# Patient Record
Sex: Female | Born: 1955 | Race: White | Hispanic: No | Marital: Married | Smoking: Former smoker
Health system: Southern US, Community
[De-identification: ages and names within clinical notes are randomized; demographics above are authoritative.]

## PROBLEM LIST (undated history)

## (undated) DIAGNOSIS — I4891 Unspecified atrial fibrillation: Secondary | ICD-10-CM

## (undated) DIAGNOSIS — F32A Depression, unspecified: Secondary | ICD-10-CM

## (undated) DIAGNOSIS — E785 Hyperlipidemia, unspecified: Secondary | ICD-10-CM

## (undated) DIAGNOSIS — I719 Aortic aneurysm of unspecified site, without rupture: Secondary | ICD-10-CM

## (undated) DIAGNOSIS — K602 Anal fissure, unspecified: Secondary | ICD-10-CM

## (undated) DIAGNOSIS — I4892 Unspecified atrial flutter: Secondary | ICD-10-CM

## (undated) DIAGNOSIS — F419 Anxiety disorder, unspecified: Secondary | ICD-10-CM

## (undated) HISTORY — DX: Aortic aneurysm of unspecified site, without rupture: I71.9

## (undated) HISTORY — DX: Unspecified atrial fibrillation: I48.91

## (undated) HISTORY — PX: HIATAL HERNIA REPAIR: SHX195

## (undated) HISTORY — PX: PLACEMENT OF BREAST IMPLANTS: SHX6334

## (undated) HISTORY — PX: CARDIAC ELECTROPHYSIOLOGY MAPPING AND ABLATION: SHX1292

## (undated) HISTORY — PX: COLONOSCOPY: SHX174

## (undated) HISTORY — DX: Anal fissure, unspecified: K60.2

## (undated) HISTORY — PX: CARDIAC PACEMAKER PLACEMENT: SHX583

## (undated) HISTORY — PX: TONSILLECTOMY: SUR1361

## (undated) HISTORY — DX: Anxiety disorder, unspecified: F41.9

## (undated) HISTORY — PX: ABDOMINAL HYSTERECTOMY: SHX81

## (undated) HISTORY — DX: Depression, unspecified: F32.A

## (undated) HISTORY — DX: Hyperlipidemia, unspecified: E78.5

## (undated) HISTORY — PX: BREAST IMPLANT REMOVAL: SUR1101

## (undated) HISTORY — DX: Unspecified atrial flutter: I48.92

---

## 2014-08-18 DIAGNOSIS — E348 Other specified endocrine disorders: Secondary | ICD-10-CM | POA: Insufficient documentation

## 2015-01-02 DIAGNOSIS — I10 Essential (primary) hypertension: Secondary | ICD-10-CM | POA: Insufficient documentation

## 2015-01-02 DIAGNOSIS — R002 Palpitations: Secondary | ICD-10-CM | POA: Insufficient documentation

## 2015-01-02 DIAGNOSIS — I483 Typical atrial flutter: Secondary | ICD-10-CM | POA: Insufficient documentation

## 2015-10-11 DIAGNOSIS — N35919 Unspecified urethral stricture, male, unspecified site: Secondary | ICD-10-CM | POA: Insufficient documentation

## 2015-10-12 DIAGNOSIS — R1319 Other dysphagia: Secondary | ICD-10-CM | POA: Insufficient documentation

## 2015-10-12 DIAGNOSIS — K2100 Gastro-esophageal reflux disease with esophagitis, without bleeding: Secondary | ICD-10-CM | POA: Insufficient documentation

## 2015-12-04 DIAGNOSIS — Z7901 Long term (current) use of anticoagulants: Secondary | ICD-10-CM | POA: Insufficient documentation

## 2016-04-02 DIAGNOSIS — R609 Edema, unspecified: Secondary | ICD-10-CM | POA: Insufficient documentation

## 2016-04-02 DIAGNOSIS — Z9889 Other specified postprocedural states: Secondary | ICD-10-CM | POA: Insufficient documentation

## 2016-11-26 DIAGNOSIS — N362 Urethral caruncle: Secondary | ICD-10-CM | POA: Insufficient documentation

## 2016-12-26 DIAGNOSIS — R8271 Bacteriuria: Secondary | ICD-10-CM | POA: Insufficient documentation

## 2017-01-17 DIAGNOSIS — Z9889 Other specified postprocedural states: Secondary | ICD-10-CM | POA: Insufficient documentation

## 2019-01-06 DIAGNOSIS — Z45018 Encounter for adjustment and management of other part of cardiac pacemaker: Secondary | ICD-10-CM | POA: Insufficient documentation

## 2019-08-24 DIAGNOSIS — I951 Orthostatic hypotension: Secondary | ICD-10-CM | POA: Insufficient documentation

## 2020-02-10 DIAGNOSIS — R5382 Chronic fatigue, unspecified: Secondary | ICD-10-CM | POA: Insufficient documentation

## 2020-02-10 DIAGNOSIS — Z950 Presence of cardiac pacemaker: Secondary | ICD-10-CM | POA: Insufficient documentation

## 2020-02-10 DIAGNOSIS — F339 Major depressive disorder, recurrent, unspecified: Secondary | ICD-10-CM | POA: Insufficient documentation

## 2020-02-10 DIAGNOSIS — E782 Mixed hyperlipidemia: Secondary | ICD-10-CM | POA: Insufficient documentation

## 2020-02-22 ENCOUNTER — Telehealth: Payer: Self-pay

## 2020-02-22 NOTE — Telephone Encounter (Signed)
Hilarie Fredrickson, MD  Chrystie Nose, RN Bonita Quin, I received a call from Dr. Valera Castle (former [place] cardiologist, well-known to me, now working at [facility] in [facility]) regarding a patient of his that he would like seen. The patient is a friend of his. I could not find her in our system. We need to arrange an office appointment to see me. Also obtain any and all outside records. He tells me that there may be records available in care everywhere. Here is what I know:  Patient name: [name] "Monica Henry "[name]. Date of birth: 08/15/1955 Phone number: [phone]  Problem: Elevated liver tests. Apparently has had blood work including liver test, CBC, autoimmune studies, viral studies  Obtain any and all outside records relevant to this issue. Dr. Vern Claude contacts at his office are named Neysa Bonito and Ursa. The number there is [phone]  I can see her as a work in appointment on Tuesday, February 29, 2020 at 1:30 PM. Please contact patient regarding this appointment.  Please put all records in my box for review, prior patient visit.  Thank you Bonita Quin!  Dr. Marina Goodell   Pt scheduled to see Dr. Marina Goodell 02/29/20@1 :40pm. Called Dr. Vern Claude office, spoke with Neysa Bonito and records are being faxed.

## 2020-02-29 ENCOUNTER — Ambulatory Visit: Payer: Medicare HMO | Admitting: Internal Medicine

## 2020-02-29 ENCOUNTER — Encounter: Payer: Self-pay | Admitting: Internal Medicine

## 2020-02-29 ENCOUNTER — Other Ambulatory Visit (INDEPENDENT_AMBULATORY_CARE_PROVIDER_SITE_OTHER): Payer: Medicare HMO

## 2020-02-29 VITALS — BP 108/70 | HR 68 | Ht 62.5 in | Wt 186.2 lb

## 2020-02-29 DIAGNOSIS — R7989 Other specified abnormal findings of blood chemistry: Secondary | ICD-10-CM | POA: Diagnosis not present

## 2020-02-29 DIAGNOSIS — Z7289 Other problems related to lifestyle: Secondary | ICD-10-CM | POA: Diagnosis not present

## 2020-02-29 DIAGNOSIS — K222 Esophageal obstruction: Secondary | ICD-10-CM | POA: Diagnosis not present

## 2020-02-29 DIAGNOSIS — K2100 Gastro-esophageal reflux disease with esophagitis, without bleeding: Secondary | ICD-10-CM

## 2020-02-29 DIAGNOSIS — F1090 Alcohol use, unspecified, uncomplicated: Secondary | ICD-10-CM

## 2020-02-29 LAB — CBC WITH DIFFERENTIAL/PLATELET
Basophils Absolute: 0 10*3/uL (ref 0.0–0.1)
Basophils Relative: 0.7 % (ref 0.0–3.0)
Eosinophils Absolute: 0.1 10*3/uL (ref 0.0–0.7)
Eosinophils Relative: 1.5 % (ref 0.0–5.0)
HCT: 38.7 % (ref 36.0–46.0)
Hemoglobin: 12.8 g/dL (ref 12.0–15.0)
Lymphocytes Relative: 26.3 % (ref 12.0–46.0)
Lymphs Abs: 1.3 10*3/uL (ref 0.7–4.0)
MCHC: 33 g/dL (ref 30.0–36.0)
MCV: 106.5 fl — ABNORMAL HIGH (ref 78.0–100.0)
Monocytes Absolute: 0.8 10*3/uL (ref 0.1–1.0)
Monocytes Relative: 16.5 % — ABNORMAL HIGH (ref 3.0–12.0)
Neutro Abs: 2.6 10*3/uL (ref 1.4–7.7)
Neutrophils Relative %: 55 % (ref 43.0–77.0)
Platelets: 79 10*3/uL — ABNORMAL LOW (ref 150.0–400.0)
RBC: 3.63 Mil/uL — ABNORMAL LOW (ref 3.87–5.11)
RDW: 14.2 % (ref 11.5–15.5)
WBC: 4.8 10*3/uL (ref 4.0–10.5)

## 2020-02-29 LAB — HEPATIC FUNCTION PANEL
ALT: 451 U/L — ABNORMAL HIGH (ref 0–35)
AST: 476 U/L — ABNORMAL HIGH (ref 0–37)
Albumin: 3.8 g/dL (ref 3.5–5.2)
Alkaline Phosphatase: 157 U/L — ABNORMAL HIGH (ref 39–117)
Bilirubin, Direct: 1 mg/dL — ABNORMAL HIGH (ref 0.0–0.3)
Total Bilirubin: 2.5 mg/dL — ABNORMAL HIGH (ref 0.2–1.2)
Total Protein: 6.6 g/dL (ref 6.0–8.3)

## 2020-02-29 LAB — BASIC METABOLIC PANEL
BUN: 9 mg/dL (ref 6–23)
CO2: 29 mEq/L (ref 19–32)
Calcium: 9.1 mg/dL (ref 8.4–10.5)
Chloride: 103 mEq/L (ref 96–112)
Creatinine, Ser: 0.55 mg/dL (ref 0.40–1.20)
GFR: 110.36 mL/min (ref >60.00)
Glucose, Bld: 85 mg/dL (ref 70–99)
Potassium: 3.9 mEq/L (ref 3.5–5.1)
Sodium: 138 mEq/L (ref 135–145)

## 2020-02-29 LAB — TSH: TSH: 1.97 u[IU]/mL (ref 0.35–4.50)

## 2020-02-29 LAB — IBC PANEL
Iron: 192 ug/dL — ABNORMAL HIGH (ref 42–145)
Saturation Ratios: 59.1 % — ABNORMAL HIGH (ref 20.0–50.0)
Transferrin: 232 mg/dL (ref 212.0–360.0)

## 2020-02-29 LAB — PROTIME-INR
INR: 2.2 ratio — ABNORMAL HIGH (ref 0.8–1.0)
Prothrombin Time: 24.2 s — ABNORMAL HIGH (ref 9.6–13.1)

## 2020-02-29 LAB — IRON: Iron: 192 ug/dL — ABNORMAL HIGH (ref 42–145)

## 2020-02-29 LAB — FERRITIN: Ferritin: 888.1 ng/mL — ABNORMAL HIGH (ref 10.0–291.0)

## 2020-02-29 NOTE — Patient Instructions (Addendum)
Your provider has requested that you go to the basement level for lab work before leaving today. Press "B" on the elevator. The lab is located at the first door on the left as you exit the elevator.  You have been scheduled for an abdominal ultrasound at [facility] (1st floor of hospital) on 03/17/2020 at 9:30am. Please arrive 15 minutes prior to your appointment for registration. Make certain not to have anything to eat or drink after midnight prior to your appointment. Should you need to reschedule your appointment, please contact radiology at [phone]. This test typically takes about 30 minutes to perform.  Please abstain from alcohol until further notice

## 2020-03-01 ENCOUNTER — Encounter: Payer: Self-pay | Admitting: Internal Medicine

## 2020-03-01 NOTE — Progress Notes (Signed)
HISTORY OF PRESENT ILLNESS:  Monica Henry is a 64 y.o. 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 Valera Castle, regarding elevated liver tests.  Patient tells me that she was diagnosed with elevated liver test several years ago when she was being evaluated and treated for her atrial arrhythmia.  Apparently, this resolved without definitive etiology identified.  She reports being in her usual state of health until the past 6 months when she developed problems with fairly constant fatigue.  This despite having recently retired.  She tells me that she saw her primary care provider back in April 2021 and underwent unspecified blood work which was "okay".  She recently established with Dr. Daleen Squibb and underwent evaluation February 10, 2020.  Due to complaints of fatigue she underwent extensive blood work and discontinuation of her statin drug.  Liver tests are markedly abnormal with AST 1292, ALT 762, complement phosphatase 137, total bilirubin 1.3.  Globulins were normal with total protein 7.1 and albumin 4.0.  CBC revealed a normal hemoglobin of 13.1.  Elevated MCV 104.3.  Decreased platelet count at 98,000.  Normal TSH.  Acute hepatitis serologies were negative.  ANA was mildly elevated at 1-160.  Cardiac echo revealed normal left ventricular function.  The patient does admit to drinking 1 bottle of wine per day.  She has not been drinking (except for beer 1 weekend) after being advised to such 2 weeks ago.  She tells me that she is feeling slightly better.  She does report that her brother had cirrhosis which she believes was related to heavy alcohol abuse.  She denies NSAIDs, Tylenol, or over-the-counter remedies.  Over this year she has gained about 10 pounds.  No new medications.  She denies edema or GI bleeding.  She does tell me that she had breast  implants removed May 2021, due to rupturing.  She has had GI care elsewhere.  Review of additional outside records finds colonoscopy and upper endoscopy being performed Nov 22, 2016 by Dr. Brennan Bailey.  Upper endoscopy was being performed for dysphagia.  She was found to have an esophageal stricture and esophagitis.  Stricture was balloon dilated to 19 mm.  Patient is on no medication for acid reflux.  She denies further problems with dysphagia though she does have occasional reflux symptoms.  Her colonoscopy was for the purposes of screening.  The colon was described as tortuous.  The exam was normal.  Follow-up in 10 years recommended.  REVIEW OF SYSTEMS:  All non-GI ROS negative otherwise stated in the HPI except for anxiety, fatigue, hearing problems, heart rhythm change, itching, shortness of breath, skin rash  Past Medical History:  Diagnosis Date  . A-fib (HCC)   . Anal fissure   . Anxiety   . Aortic aneurysm (HCC)   . Atrial flutter (HCC)   . Depression   . Hyperlipidemia     Past Surgical History:  Procedure Laterality Date  . ABDOMINAL HYSTERECTOMY    . BREAST IMPLANT REMOVAL Bilateral   . CARDIAC ELECTROPHYSIOLOGY MAPPING AND ABLATION    . CARDIAC PACEMAKER PLACEMENT    . HIATAL HERNIA REPAIR    . PLACEMENT OF BREAST IMPLANTS Bilateral   . TONSILLECTOMY      Social History Monica Henry  reports that she quit smoking about 40 years ago. Her smoking use included cigarettes. She has never used smokeless tobacco. She  reports current alcohol use of about 1.0 standard drink of alcohol per week. She reports that she does not use drugs.  family history includes Basal cell carcinoma in her father; Cirrhosis in her brother; Diabetes in her maternal grandfather; Diverticulitis in her sister; Heart attack in her brother; Heart disease in her father, mother, and sister; Hypertension in her father; Kidney disease in her brother; Sleep apnea in her sister; Stroke in her maternal  grandmother; Stroke (age of onset: 37) in her son.  Allergies  Allergen Reactions  . Sulfa Antibiotics Nausea And Vomiting       PHYSICAL EXAMINATION: Vital signs: BP 108/70 (BP Location: Left Arm, Patient Position: Sitting, Cuff Size: Normal)   Pulse 68   Ht 5' 2.5" (1.588 m) Comment: height measured without shoes  Wt 186 lb 4 oz (84.5 kg)   BMI 33.52 kg/m   Constitutional: generally well-appearing, no acute distress Psychiatric: alert and oriented x3, cooperative Eyes: extraocular movements intact, anicteric, conjunctiva pink Mouth: oral pharynx moist, no lesions Neck: supple no lymphadenopathy Cardiovascular: heart regular rate and rhythm, no murmur Lungs: clear to auscultation bilaterally Abdomen: soft, nontender, nondistended, no obvious ascites, no peritoneal signs, normal bowel sounds, no organomegaly Rectal: Omitted Extremities: no clubbing, cyanosis, or lower extremity edema bilaterally Skin: no lesions on visible extremities Neuro: No focal deficits. No asterixis.    ASSESSMENT:  1.  Markedly elevated hepatic transaminases.  The differential diagnosis of this pattern includes viral agents, toxins, vascular insult, autoimmune hepatitis.  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  Chronic alcohol abuse.  Now abstaining 3.  Brother with cirrhosis, presumed to be alcohol related 4.  Fatigue.  Presumably related to her liver issues 5.  History of atrial arrhythmia status post ablation on chronic anticoagulation therapy 6.  GERD with a history of esophagitis and esophageal stricture.  Status post dilation May 2018.  No longer on reflux medication.  Denies recurrent dysphagia   PLAN:  1.  Expand laboratory work-up to evaluate further for causes of elevated hepatic transaminases.  Also, repeat liver tests and obtain PT/INR to assess synthetic function 2.  Continue to avoid all alcohol for the time being 3.  Abdominal ultrasound with attention to the liver 4.  Patient  may need a liver biopsy.  We discussed this today.  I spoke with Dr. Daleen Squibb, her cardiologist, who approved holding her oral anticoagulation therapy should biopsy be needed. 5.  Reflux precautions 6.  Consider PPI given history of esophagitis and stricture 7.  Due for repeat routine screening colonoscopy around 2028 8.  GI follow-up to be arranged after the above. A total time of 60 minutes was spent preparing to see the patient, reviewing outside tests, radiology, and procedures.  Reviewing outside history.  Separately obtaining current history.  Performing comprehensive physical examination.  Counseling the patient regarding her above listed issues.  Ordering advanced laboratory testing and radiology.  Arranging follow-up.  Documenting clinical information in the health record

## 2020-03-04 LAB — HEPATITIS A ANTIBODY, TOTAL: Hepatitis A AB,Total: NONREACTIVE

## 2020-03-04 LAB — ALPHA-1-ANTITRYPSIN: A-1 Antitrypsin, Ser: 138 mg/dL (ref 83–199)

## 2020-03-04 LAB — ANTI-NUCLEAR AB-TITER (ANA TITER)
ANA TITER: 1:320 {titer} — ABNORMAL HIGH
ANA Titer 1: 1:80 {titer} — ABNORMAL HIGH

## 2020-03-04 LAB — ANTI-SMOOTH MUSCLE ANTIBODY, IGG: Actin (Smooth Muscle) Antibody (IGG): <20 U (ref <20)

## 2020-03-04 LAB — ANA: Anti Nuclear Antibody (ANA): POSITIVE — AB

## 2020-03-04 LAB — MITOCHONDRIAL ANTIBODIES: Mitochondrial M2 Ab, IgG: <=20 U

## 2020-03-04 LAB — CERULOPLASMIN: Ceruloplasmin: 31 mg/dL (ref 18–53)

## 2020-03-07 ENCOUNTER — Other Ambulatory Visit: Payer: Self-pay

## 2020-03-07 DIAGNOSIS — R7989 Other specified abnormal findings of blood chemistry: Secondary | ICD-10-CM

## 2020-03-07 DIAGNOSIS — F1090 Alcohol use, unspecified, uncomplicated: Secondary | ICD-10-CM

## 2020-03-08 ENCOUNTER — Other Ambulatory Visit: Payer: Self-pay

## 2020-03-08 DIAGNOSIS — F1090 Alcohol use, unspecified, uncomplicated: Secondary | ICD-10-CM

## 2020-03-08 DIAGNOSIS — R7989 Other specified abnormal findings of blood chemistry: Secondary | ICD-10-CM

## 2020-03-15 DIAGNOSIS — R55 Syncope and collapse: Secondary | ICD-10-CM | POA: Insufficient documentation

## 2020-03-15 DIAGNOSIS — K754 Autoimmune hepatitis: Secondary | ICD-10-CM | POA: Insufficient documentation

## 2020-03-15 DIAGNOSIS — Z7901 Long term (current) use of anticoagulants: Secondary | ICD-10-CM | POA: Insufficient documentation

## 2020-03-17 ENCOUNTER — Other Ambulatory Visit: Payer: Self-pay

## 2020-03-17 ENCOUNTER — Ambulatory Visit (HOSPITAL_COMMUNITY)
Admission: RE | Admit: 2020-03-17 | Discharge: 2020-03-17 | Disposition: A | Discharge status: Home / Self Care | Payer: Medicare HMO | Source: Ambulatory Visit | Attending: Internal Medicine | Admitting: Internal Medicine

## 2020-03-17 DIAGNOSIS — R7989 Other specified abnormal findings of blood chemistry: Secondary | ICD-10-CM | POA: Insufficient documentation

## 2020-03-21 ENCOUNTER — Telehealth: Payer: Self-pay | Admitting: Internal Medicine

## 2020-03-21 NOTE — Telephone Encounter (Signed)
Spoke with pt and she is aware of results, see result note. 

## 2020-03-29 ENCOUNTER — Ambulatory Visit: Payer: Medicare HMO | Admitting: Internal Medicine

## 2020-03-30 ENCOUNTER — Telehealth: Payer: Self-pay

## 2020-03-30 NOTE — Telephone Encounter (Signed)
-----   Message from Hilarie Fredrickson, MD sent at 03/29/2020  1:57 PM EDT ----- Regarding: Canceled appointment Tosh Glaze,I have been evaluating this patient very recently for significantly abnormal liver tests.  She was scheduled for an office visit this morning.  She canceled (not sure why) and rescheduled for December.  I really need to see her sooner rather than later.  Please contact her and see what is going on.  Thank you.JP

## 2020-03-30 NOTE — Telephone Encounter (Signed)
Attempted to call pt, phone has continuous busy signal.

## 2020-04-03 NOTE — Telephone Encounter (Signed)
Called pt and get continuous busy signal.

## 2020-04-06 NOTE — Telephone Encounter (Signed)
Attempted to reach pt by phone, still get fast busy signal. Letter mailed to pt requesting she contact our office to be seen sooner.

## 2020-04-06 NOTE — Telephone Encounter (Signed)
Thank you for trying to reach her.  Thank you for sending her letter.  I will let the referring doctor know.   Thanks

## 2020-04-06 NOTE — Telephone Encounter (Signed)
Pt just called and I moved her appt to 05/08/20. She saw the letter on mychart. Let her know we had not been able to reach her via phone. States she is coming on Monday for labs.

## 2020-04-06 NOTE — Telephone Encounter (Signed)
That is great. Thanks! 

## 2020-04-14 ENCOUNTER — Other Ambulatory Visit (INDEPENDENT_AMBULATORY_CARE_PROVIDER_SITE_OTHER): Payer: Medicare HMO

## 2020-04-14 DIAGNOSIS — F1090 Alcohol use, unspecified, uncomplicated: Secondary | ICD-10-CM

## 2020-04-14 DIAGNOSIS — R7989 Other specified abnormal findings of blood chemistry: Secondary | ICD-10-CM

## 2020-04-14 DIAGNOSIS — Z7289 Other problems related to lifestyle: Secondary | ICD-10-CM

## 2020-04-14 LAB — CBC WITH DIFFERENTIAL/PLATELET
Basophils Absolute: 0 10*3/uL (ref 0.0–0.1)
Basophils Relative: 0.9 % (ref 0.0–3.0)
Eosinophils Absolute: 0.1 10*3/uL (ref 0.0–0.7)
Eosinophils Relative: 1.8 % (ref 0.0–5.0)
HCT: 42.6 % (ref 36.0–46.0)
Hemoglobin: 14.3 g/dL (ref 12.0–15.0)
Lymphocytes Relative: 26.9 % (ref 12.0–46.0)
Lymphs Abs: 1.1 10*3/uL (ref 0.7–4.0)
MCHC: 33.6 g/dL (ref 30.0–36.0)
MCV: 106.5 fl — ABNORMAL HIGH (ref 78.0–100.0)
Monocytes Absolute: 0.7 10*3/uL (ref 0.1–1.0)
Monocytes Relative: 16 % — ABNORMAL HIGH (ref 3.0–12.0)
Neutro Abs: 2.3 10*3/uL (ref 1.4–7.7)
Neutrophils Relative %: 54.4 % (ref 43.0–77.0)
Platelets: 111 10*3/uL — ABNORMAL LOW (ref 150.0–400.0)
RBC: 4 Mil/uL (ref 3.87–5.11)
RDW: 14 % (ref 11.5–15.5)
WBC: 4.1 10*3/uL (ref 4.0–10.5)

## 2020-04-14 LAB — HEPATIC FUNCTION PANEL
ALT: 152 U/L — ABNORMAL HIGH (ref 0–35)
AST: 168 U/L — ABNORMAL HIGH (ref 0–37)
Albumin: 3.9 g/dL (ref 3.5–5.2)
Alkaline Phosphatase: 118 U/L — ABNORMAL HIGH (ref 39–117)
Bilirubin, Direct: 0.9 mg/dL — ABNORMAL HIGH (ref 0.0–0.3)
Total Bilirubin: 2.5 mg/dL — ABNORMAL HIGH (ref 0.2–1.2)
Total Protein: 7.2 g/dL (ref 6.0–8.3)

## 2020-04-14 LAB — PROTIME-INR
INR: 1.6 ratio — ABNORMAL HIGH (ref 0.8–1.0)
Prothrombin Time: 17.4 s — ABNORMAL HIGH (ref 9.6–13.1)

## 2020-04-23 LAB — HEMOCHROMATOSIS DNA-PCR(C282Y,H63D)

## 2020-05-08 ENCOUNTER — Other Ambulatory Visit (INDEPENDENT_AMBULATORY_CARE_PROVIDER_SITE_OTHER): Payer: Medicare HMO

## 2020-05-08 ENCOUNTER — Other Ambulatory Visit: Payer: Self-pay

## 2020-05-08 ENCOUNTER — Ambulatory Visit: Payer: Medicare HMO | Admitting: Internal Medicine

## 2020-05-08 ENCOUNTER — Encounter: Payer: Self-pay | Admitting: Internal Medicine

## 2020-05-08 VITALS — BP 118/80 | HR 62 | Ht 62.5 in | Wt 180.0 lb

## 2020-05-08 DIAGNOSIS — R7989 Other specified abnormal findings of blood chemistry: Secondary | ICD-10-CM

## 2020-05-08 DIAGNOSIS — K2100 Gastro-esophageal reflux disease with esophagitis, without bleeding: Secondary | ICD-10-CM

## 2020-05-08 DIAGNOSIS — F1090 Alcohol use, unspecified, uncomplicated: Secondary | ICD-10-CM

## 2020-05-08 DIAGNOSIS — Z7289 Other problems related to lifestyle: Secondary | ICD-10-CM | POA: Diagnosis not present

## 2020-05-08 DIAGNOSIS — K222 Esophageal obstruction: Secondary | ICD-10-CM | POA: Diagnosis not present

## 2020-05-08 LAB — COMPREHENSIVE METABOLIC PANEL
ALT: 166 U/L — ABNORMAL HIGH (ref 0–35)
AST: 260 U/L — ABNORMAL HIGH (ref 0–37)
Albumin: 3.7 g/dL (ref 3.5–5.2)
Alkaline Phosphatase: 123 U/L — ABNORMAL HIGH (ref 39–117)
BUN: 8 mg/dL (ref 6–23)
CO2: 29 mEq/L (ref 19–32)
Calcium: 9.1 mg/dL (ref 8.4–10.5)
Chloride: 100 mEq/L (ref 96–112)
Creatinine, Ser: 0.56 mg/dL (ref 0.40–1.20)
GFR: 94.82 mL/min (ref >60.00)
Glucose, Bld: 91 mg/dL (ref 70–99)
Potassium: 4.3 mEq/L (ref 3.5–5.1)
Sodium: 136 mEq/L (ref 135–145)
Total Bilirubin: 1.6 mg/dL — ABNORMAL HIGH (ref 0.2–1.2)
Total Protein: 6.7 g/dL (ref 6.0–8.3)

## 2020-05-08 LAB — CBC WITH DIFFERENTIAL/PLATELET
Basophils Absolute: 0 10*3/uL (ref 0.0–0.1)
Basophils Relative: 0.9 % (ref 0.0–3.0)
Eosinophils Absolute: 0.1 10*3/uL (ref 0.0–0.7)
Eosinophils Relative: 1.7 % (ref 0.0–5.0)
HCT: 41 % (ref 36.0–46.0)
Hemoglobin: 13.9 g/dL (ref 12.0–15.0)
Lymphocytes Relative: 25.4 % (ref 12.0–46.0)
Lymphs Abs: 1 10*3/uL (ref 0.7–4.0)
MCHC: 33.8 g/dL (ref 30.0–36.0)
MCV: 103.4 fl — ABNORMAL HIGH (ref 78.0–100.0)
Monocytes Absolute: 0.5 10*3/uL (ref 0.1–1.0)
Monocytes Relative: 13.9 % — ABNORMAL HIGH (ref 3.0–12.0)
Neutro Abs: 2.3 10*3/uL (ref 1.4–7.7)
Neutrophils Relative %: 58.1 % (ref 43.0–77.0)
Platelets: 66 10*3/uL — ABNORMAL LOW (ref 150.0–400.0)
RBC: 3.96 Mil/uL (ref 3.87–5.11)
RDW: 13.2 % (ref 11.5–15.5)
WBC: 3.9 10*3/uL — ABNORMAL LOW (ref 4.0–10.5)

## 2020-05-08 LAB — PROTIME-INR
INR: 1.5 ratio — ABNORMAL HIGH (ref 0.8–1.0)
Prothrombin Time: 16.6 s — ABNORMAL HIGH (ref 9.6–13.1)

## 2020-05-08 NOTE — Progress Notes (Signed)
HISTORY OF PRESENT ILLNESS:  Monica Henry is a 64 y.o. female with atrial fibrillation on Eliquis, hyperlipidemia, anxiety/depression, and chronic alcohol abuse who was evaluated initially February 29, 2020 regarding markedly elevated hepatic transaminases.  She underwent a myriad of laboratory studies.  The only abnormalities were elevated ANA and ferritin/iron studies.  Genetic testing for hereditary hemochromatosis returned normal.  She was felt to have possible drug (statin) induced versus primary autoimmune hepatitis with a component of alcohol-related liver disease.  Abdominal ultrasound shows unremarkable with normal-appearing liver and spleen.  Follow-up blood work here April 14, 2020 significant improvement in liver tests with AST 168, ALT 152, alk phosphatase 118, and total bilirubin 2.5.  Normal globulins.  CBC with elevated MCV at 106.5 and platelet count 111,000.  INR was 1.6 (on Eliquis).  Normal thyroid testing.  She presents today for follow-up.  Patient tells me that she has stopped drinking wine.  However she drinks 2 or 3 beers several times per week.  Overall she states she is feeling better with improved energy levels (which was a chief complaint back in August).  No new GI complaints  REVIEW OF SYSTEMS:  All non-GI ROS negative unless otherwise stated in the HPI except for anxiety, depression, fatigue, hearing problems, heart rhythm change, itching, skin rash  Past Medical History:  Diagnosis Date  . A-fib ([place])   . Anal fissure   . Anxiety   . Aortic aneurysm ([place])   . Atrial flutter ([place])   . Depression   . Hyperlipidemia     Past Surgical History:  Procedure Laterality Date  . ABDOMINAL HYSTERECTOMY    . BREAST IMPLANT REMOVAL Bilateral   . CARDIAC ELECTROPHYSIOLOGY MAPPING AND ABLATION    . CARDIAC PACEMAKER PLACEMENT    . HIATAL HERNIA REPAIR    . PLACEMENT OF BREAST IMPLANTS Bilateral   . TONSILLECTOMY      Social History Monica Henry  reports  that she quit smoking about 40 years ago. Her smoking use included cigarettes. She has never used smokeless tobacco. She reports current alcohol use of about 1.0 standard drink of alcohol per week. She reports that she does not use drugs.  family history includes Basal cell carcinoma in her father; Cirrhosis in her brother; Diabetes in her maternal grandfather; Diverticulitis in her sister; Heart attack in her brother; Heart disease in her father, mother, and sister; Hypertension in her father; Kidney disease in her brother; Sleep apnea in her sister; Stroke in her maternal grandmother; Stroke (age of onset: 6) in her son.  Allergies  Allergen Reactions  . Sulfa Antibiotics Nausea And Vomiting       PHYSICAL EXAMINATION: Vital signs: BP 118/80   Pulse 62   Ht 5' 2.5" (1.588 m)   Wt 180 lb (81.6 kg)   SpO2 97%   BMI 32.40 kg/m   Constitutional: generally well-appearing, no acute distress Psychiatric: alert and oriented x3, cooperative Eyes: extraocular movements intact, anicteric, conjunctiva pink Mouth: oral pharynx moist, no lesions Neck: supple no lymphadenopathy Cardiovascular: heart regular rate and rhythm, no murmur Lungs: clear to auscultation bilaterally Abdomen: soft, nontender, nondistended, no obvious ascites, no peritoneal signs, normal bowel sounds, no organomegaly Rectal: Omitted Extremities: no clubbing, cyanosis, or lower extremity edema bilaterally Skin: no lesions on visible extremities Neuro: No focal deficits. No asterixis.    ASSESSMENT:  1.  Markedly elevated transaminases.  Elevated ANA and significant chronic alcohol use.  Felt possibly to have primary or secondary autoimmune hepatitis (with or without  a component of alcohol-related hepatitis).  Continue improvement liver tests in time.  Has not eliminated alcohol but this has been reduced.  Family history of cirrhosis in her brother. 2.  GERD.  History of esophagitis and stricture.  Status post dilation  May 2018.  No longer on reflux medication.  Denies recurrent dysphagia 3.  History of atrial arrhythmia on Eliquis.  Has been referred by Dr. Verl Blalock to an electrophysiologist. 4.  Screening colonoscopy 2008.  Negative   PLAN:  1.  Repeat comprehensive metabolic panel, CBC, PT/INR today.  We will contact patient with results and additional recommendations 2.  Abstain from all alcohol.  Emphasized 3.  We discussed potential role of liver biopsy and/or steroid treatment.  We will wait on follow-up liver test 4.  Continue to follow 2 months.  Contact the office in the interim for questions or problems

## 2020-05-08 NOTE — Patient Instructions (Addendum)
Your provider has requested that you go to the basement level for lab work before leaving today. Press "B" on the elevator. The lab is located at the first door on the left as you exit the elevator.  Please follow up on 07/04/2020 at 3:00pm

## 2020-05-18 DIAGNOSIS — G4733 Obstructive sleep apnea (adult) (pediatric): Secondary | ICD-10-CM | POA: Insufficient documentation

## 2020-05-18 DIAGNOSIS — I495 Sick sinus syndrome: Secondary | ICD-10-CM | POA: Insufficient documentation

## 2020-06-02 ENCOUNTER — Ambulatory Visit: Payer: Medicare HMO | Admitting: Internal Medicine

## 2020-06-02 ENCOUNTER — Other Ambulatory Visit (INDEPENDENT_AMBULATORY_CARE_PROVIDER_SITE_OTHER): Payer: Medicare HMO

## 2020-06-02 DIAGNOSIS — R7989 Other specified abnormal findings of blood chemistry: Secondary | ICD-10-CM

## 2020-06-02 LAB — HEPATIC FUNCTION PANEL
ALT: 56 U/L — ABNORMAL HIGH (ref 0–35)
AST: 53 U/L — ABNORMAL HIGH (ref 0–37)
Albumin: 4.1 g/dL (ref 3.5–5.2)
Alkaline Phosphatase: 80 U/L (ref 39–117)
Bilirubin, Direct: 0.3 mg/dL (ref 0.0–0.3)
Total Bilirubin: 1.2 mg/dL (ref 0.2–1.2)
Total Protein: 7.2 g/dL (ref 6.0–8.3)

## 2020-07-04 ENCOUNTER — Encounter: Payer: Self-pay | Admitting: Internal Medicine

## 2020-07-04 ENCOUNTER — Ambulatory Visit: Payer: Medicare HMO | Admitting: Internal Medicine

## 2020-07-04 ENCOUNTER — Other Ambulatory Visit (INDEPENDENT_AMBULATORY_CARE_PROVIDER_SITE_OTHER): Payer: Medicare HMO

## 2020-07-04 VITALS — BP 104/68 | HR 78 | Ht 62.0 in | Wt 175.0 lb

## 2020-07-04 DIAGNOSIS — K2100 Gastro-esophageal reflux disease with esophagitis, without bleeding: Secondary | ICD-10-CM | POA: Diagnosis not present

## 2020-07-04 DIAGNOSIS — R7989 Other specified abnormal findings of blood chemistry: Secondary | ICD-10-CM

## 2020-07-04 DIAGNOSIS — F1090 Alcohol use, unspecified, uncomplicated: Secondary | ICD-10-CM

## 2020-07-04 DIAGNOSIS — Z7289 Other problems related to lifestyle: Secondary | ICD-10-CM | POA: Diagnosis not present

## 2020-07-04 LAB — COMPREHENSIVE METABOLIC PANEL
ALT: 24 U/L (ref 0–35)
AST: 32 U/L (ref 0–37)
Albumin: 4.2 g/dL (ref 3.5–5.2)
Alkaline Phosphatase: 77 U/L (ref 39–117)
BUN: 12 mg/dL (ref 6–23)
CO2: 29 mEq/L (ref 19–32)
Calcium: 9.4 mg/dL (ref 8.4–10.5)
Chloride: 99 mEq/L (ref 96–112)
Creatinine, Ser: 0.67 mg/dL (ref 0.40–1.20)
GFR: 90.71 mL/min (ref >60.00)
Glucose, Bld: 90 mg/dL (ref 70–99)
Potassium: 4.6 mEq/L (ref 3.5–5.1)
Sodium: 133 mEq/L — ABNORMAL LOW (ref 135–145)
Total Bilirubin: 0.9 mg/dL (ref 0.2–1.2)
Total Protein: 7.3 g/dL (ref 6.0–8.3)

## 2020-07-04 LAB — CBC WITH DIFFERENTIAL/PLATELET
Basophils Absolute: 0 10*3/uL (ref 0.0–0.1)
Basophils Relative: 0.5 % (ref 0.0–3.0)
Eosinophils Absolute: 0.1 10*3/uL (ref 0.0–0.7)
Eosinophils Relative: 2 % (ref 0.0–5.0)
HCT: 39.7 % (ref 36.0–46.0)
Hemoglobin: 13.6 g/dL (ref 12.0–15.0)
Lymphocytes Relative: 29.4 % (ref 12.0–46.0)
Lymphs Abs: 1.5 10*3/uL (ref 0.7–4.0)
MCHC: 34.3 g/dL (ref 30.0–36.0)
MCV: 102.5 fl — ABNORMAL HIGH (ref 78.0–100.0)
Monocytes Absolute: 0.7 10*3/uL (ref 0.1–1.0)
Monocytes Relative: 13.3 % — ABNORMAL HIGH (ref 3.0–12.0)
Neutro Abs: 2.9 10*3/uL (ref 1.4–7.7)
Neutrophils Relative %: 54.8 % (ref 43.0–77.0)
Platelets: 126 10*3/uL — ABNORMAL LOW (ref 150.0–400.0)
RBC: 3.87 Mil/uL (ref 3.87–5.11)
RDW: 12.3 % (ref 11.5–15.5)
WBC: 5.3 10*3/uL (ref 4.0–10.5)

## 2020-07-04 LAB — PROTIME-INR
INR: 1.3 ratio — ABNORMAL HIGH (ref 0.8–1.0)
Prothrombin Time: 14.3 s — ABNORMAL HIGH (ref 9.6–13.1)

## 2020-07-04 NOTE — Patient Instructions (Signed)
Your provider has requested that you go to the basement level for lab work before leaving today. Press "B" on the elevator. The lab is located at the first door on the left as you exit the elevator.  Please follow up in 6 months 

## 2020-07-05 ENCOUNTER — Encounter: Payer: Self-pay | Admitting: Internal Medicine

## 2020-07-05 NOTE — Progress Notes (Signed)
HISTORY OF PRESENT ILLNESS:  Monica Henry is a 65 y.o. female with a history of atrial fibrillation on Eliquis, hyperlipidemia, anxiety/depression, and chronic alcohol abuse who was initially evaluated February 29, 2020 regarding markedly elevated hepatic transaminases.  See that dictation.  Subsequent extensive work-up was unremarkable except for elevation of ANA and ferritin.  Genetic testing for hemochromatosis was normal.  She was felt to have possible drug-induced (statin) versus primary autoimmune hepatitis complicated by alcohol related liver test abnormalities.  She was last seen November 2008.  See details.  She has been off statin therapy since August.  She is significantly reduced her alcohol consumption.  Actually, limited alcohol for some time.  More recently she tells me she has been drinking about 5 beers per week over the holidays.  She has had ongoing continued improvement of her laboratories without other intervention.  She did undergo successful cardiac ablation June 07, 2020.  She is feeling much better.  Her last liver test revealed AST of 53 and ALT of 56.  Total bilirubin 1.2.  Normal globulins.  REVIEW OF SYSTEMS:  All non-GI ROS negative unless otherwise stated in the HPI except for skin rash  Past Medical History:  Diagnosis Date  . A-fib (HCC)   . Anal fissure   . Anxiety   . Aortic aneurysm (HCC)   . Atrial flutter (HCC)   . Depression   . Hyperlipidemia     Past Surgical History:  Procedure Laterality Date  . ABDOMINAL HYSTERECTOMY    . BREAST IMPLANT REMOVAL Bilateral   . CARDIAC ELECTROPHYSIOLOGY MAPPING AND ABLATION    . CARDIAC PACEMAKER PLACEMENT    . HIATAL HERNIA REPAIR    . PLACEMENT OF BREAST IMPLANTS Bilateral   . TONSILLECTOMY      Social History Salah Burlison  reports that she quit smoking about 40 years ago. Her smoking use included cigarettes. She has never used smokeless tobacco. She reports current alcohol use of about 1.0  standard drink of alcohol per week. She reports that she does not use drugs.  family history includes Basal cell carcinoma in her father; Cirrhosis in her brother; Diabetes in her maternal grandfather; Diverticulitis in her sister; Heart attack in her brother; Heart disease in her father, mother, and sister; Hypertension in her father; Kidney disease in her brother; Sleep apnea in her sister; Stroke in her maternal grandmother; Stroke (age of onset: 25) in her son.  Allergies  Allergen Reactions  . Sulfa Antibiotics Nausea And Vomiting       PHYSICAL EXAMINATION: Vital signs: BP 104/68   Pulse 78   Ht 5\' 2"  (1.575 m)   Wt 175 lb (79.4 kg)   BMI 32.01 kg/m   Constitutional: generally well-appearing, no acute distress Psychiatric: alert and oriented x3, cooperative Eyes: extraocular movements intact, anicteric, conjunctiva pink Mouth: oral pharynx moist, no lesions Neck: supple no lymphadenopathy Cardiovascular: heart regular rate and rhythm, no murmur Lungs: clear to auscultation bilaterally Abdomen: soft, nontender, nondistended, no obvious ascites, no peritoneal signs, normal bowel sounds, no organomegaly Rectal: Omitted Extremities: no clubbing, cyanosis, or lower extremity edema bilaterally Skin: no lesions on visible extremities Neuro: No focal deficits. No asterixis.    ASSESSMENT:  1.  Markedly elevated hepatic transaminases.  Elevated ANA and significant chronic alcohol use.  She was felt possibly to have either primary or secondary autoimmune hepatitis (with or without a component of alcohol-related hepatitis).  She has had continued improvement of her liver tests over time off statin (which  may induce an autoimmune picture) and alcohol. 2.  History of atrial fibrillation.  Status post recent successful ablation therapy 3.  Screening colonoscopy 2018.  Negative 4.  GERD with history of esophagitis and stricture.  Status post dilation 2018.  No longer on reflux  medication.  Denies reflux symptoms or dysphagia   PLAN:  1.  Encouraged to avoid all alcohol 2.  Continue off statin 3.  Reflux precautions 4.  Colonoscopy 2028 5.  Repeat laboratories today including comprehensive metabolic panel, CBC, PT/INR 6.  Routine office follow-up 6 months  ADDENDUM: Laboratories have returned.  Liver tests have completely normalized.  Still with elevated INR and low platelets, though both improved.  As well improving elevated MCV.  No new recommendations.  We will repeat blood work in 6 months.  I have communicated this information to the patient as well as the referring physician Dr. Valera Castle. A total time of 30 minutes was spent preparing to see the patient, reviewing test laboratories, obtaining interval comprehensive history, performing medically appropriate physical exam, counseling the patient regarding her above listed issues, ordering additional blood testing, and documenting clinical information in the health record.

## 2021-03-08 IMAGING — US US ABDOMEN COMPLETE
2 series · 14 of 25 positions shown · non-contrast
Comparison: None.

CLINICAL DATA: Elevated liver function tests

EXAM:
ABDOMEN ULTRASOUND COMPLETE

[Series 1: us abdomen complete · 13 of 53 slices shown (1 of 2)]
[im 1/53]
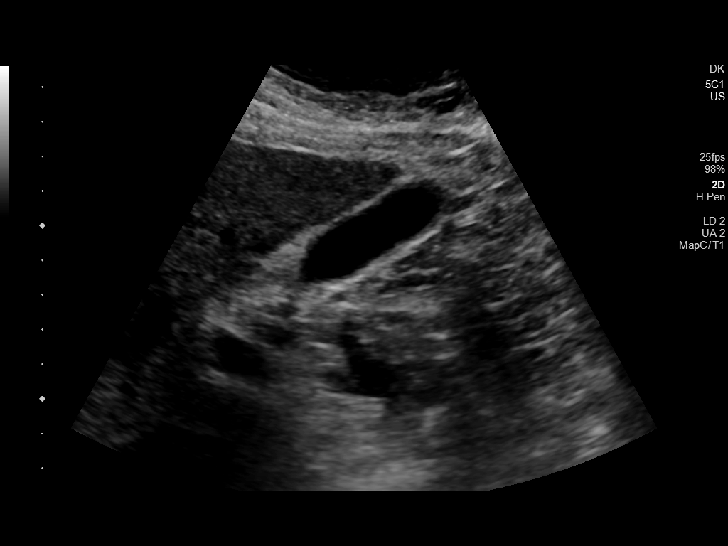
[im 5/53]
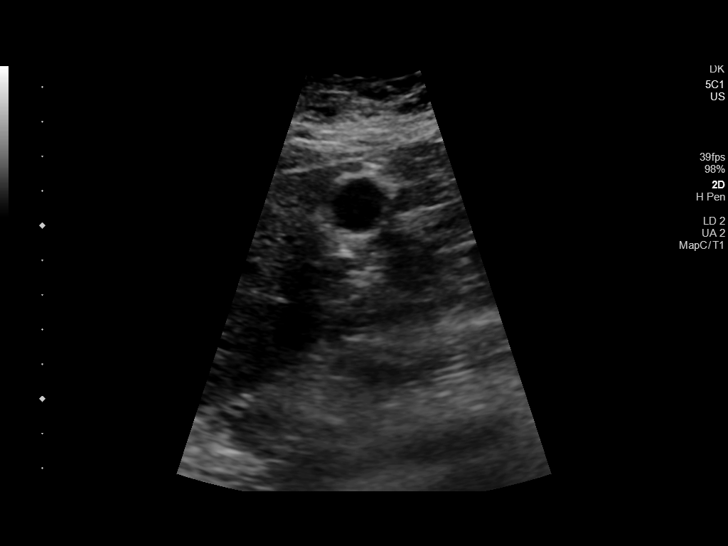
[im 10/53]
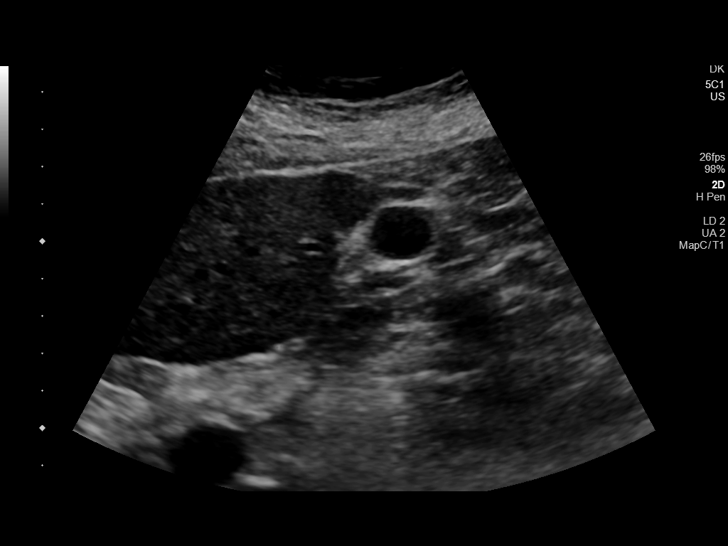
[im 14/53]
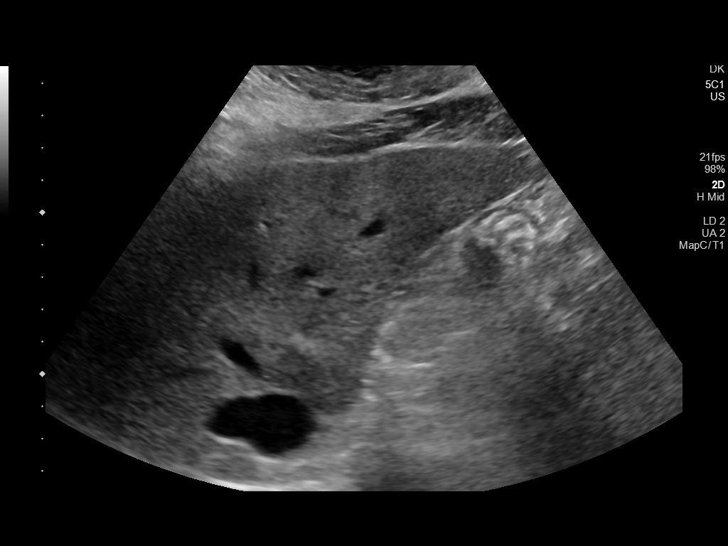
[im 19/53]
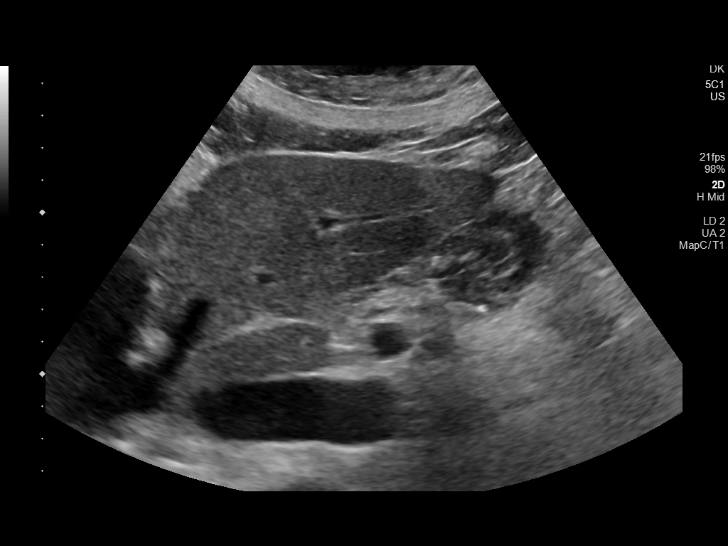
[im 21/53]
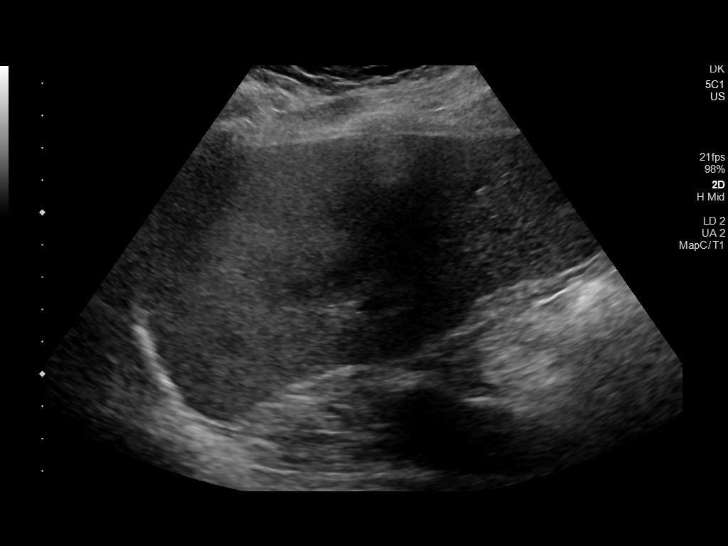
[im 25/53]
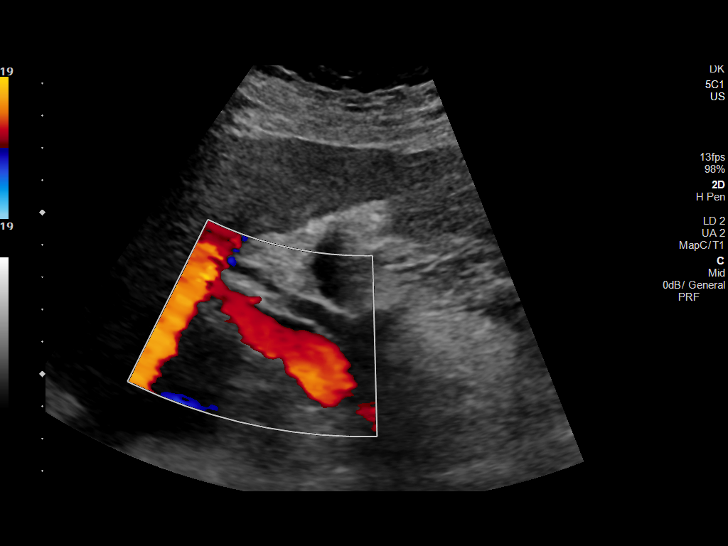
[im 30/53]
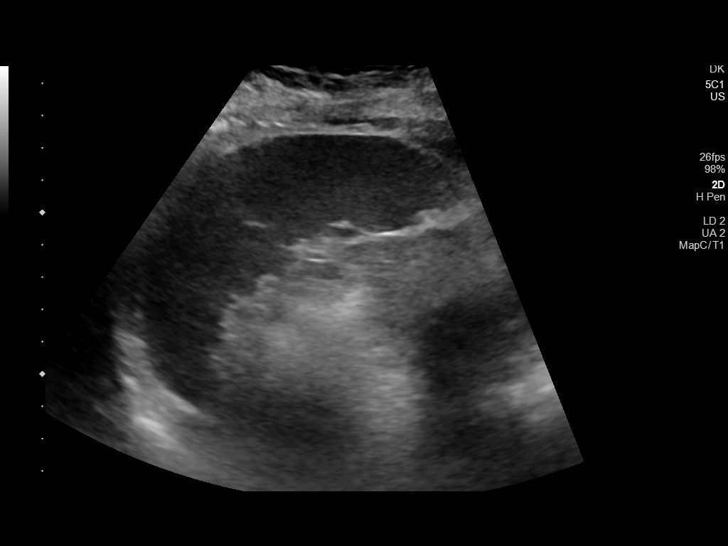
[im 34/53]
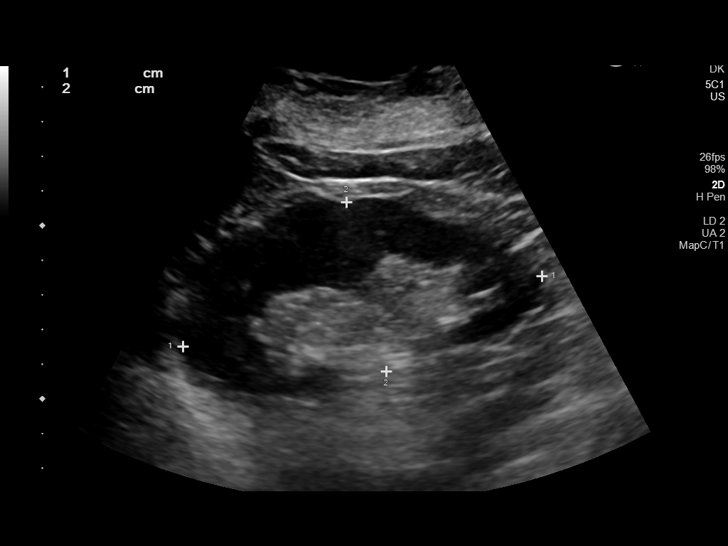
[im 37/53]
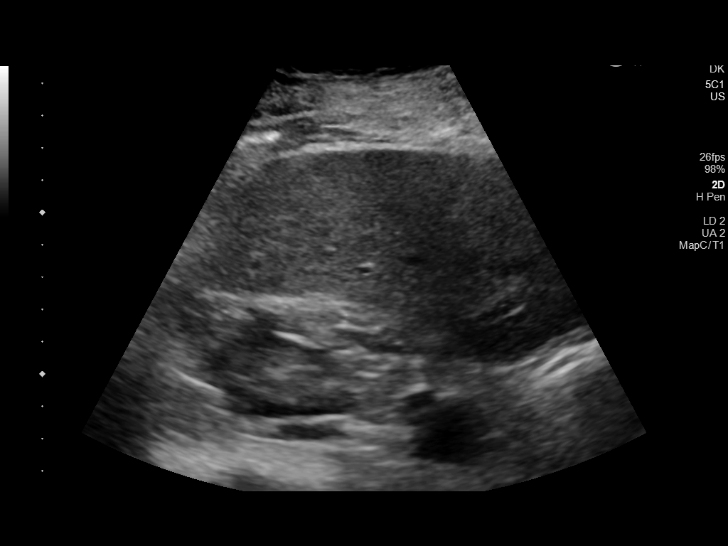
[im 41/53]
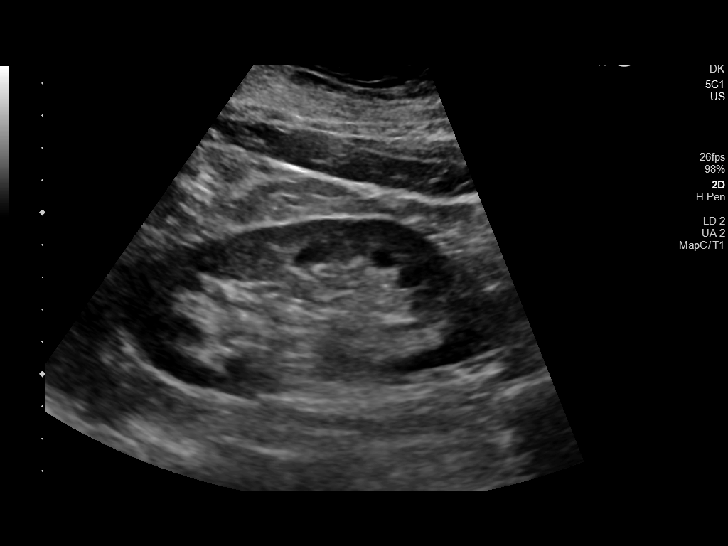
[im 46/53]
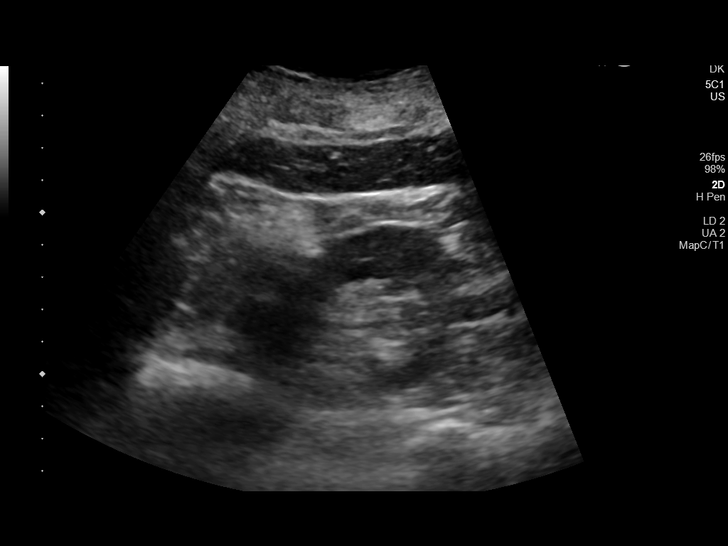
[im 50/53]
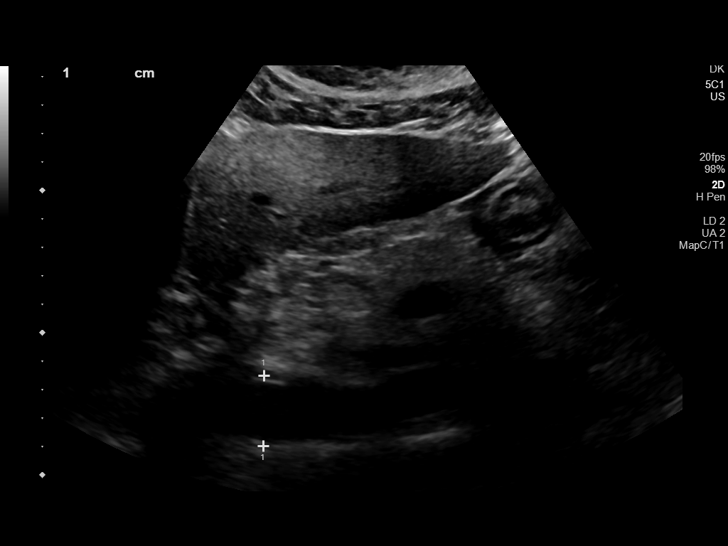

[Series 2: us abdomen complete · 1 of 1 slices shown (2 of 2)]
[im 1/1]
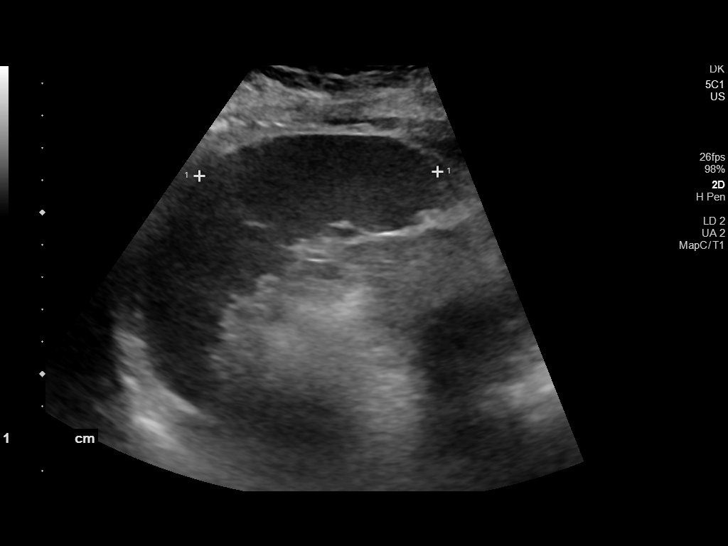

[14 of 25 positions shown; findings below may reference images not displayed]

FINDINGS: Gallbladder: No gallstones or wall thickening visualized. No
sonographic Murphy sign noted by sonographer.

Common bile duct: Diameter: 7 mm

Liver: No focal lesion identified. Within normal limits in
parenchymal echogenicity. Portal vein is patent on color Doppler
imaging with normal direction of blood flow towards the liver.

IVC: No abnormality visualized.

Pancreas: Visualized portion unremarkable.

Spleen: Size and appearance within normal limits.

Right Kidney: Length: 10.6 cm. Echogenicity within normal limits. No
mass or hydronephrosis visualized.

Left Kidney: Length: 11.8 cm. Echogenicity within normal limits. No
mass or hydronephrosis visualized.

Abdominal aorta: No aneurysm visualized.

Other findings: None.
IMPRESSION: 1. Unremarkable abdominal ultrasound.

## 2021-06-15 DIAGNOSIS — R898 Other abnormal findings in specimens from other organs, systems and tissues: Secondary | ICD-10-CM | POA: Insufficient documentation

## 2021-09-27 ENCOUNTER — Telehealth: Payer: Self-pay | Admitting: Internal Medicine

## 2021-09-27 ENCOUNTER — Other Ambulatory Visit: Payer: Self-pay

## 2021-09-27 DIAGNOSIS — R197 Diarrhea, unspecified: Secondary | ICD-10-CM

## 2021-09-27 NOTE — Telephone Encounter (Signed)
Talk to the charge nurse and work something out ?

## 2021-09-27 NOTE — Telephone Encounter (Signed)
Called and spoke to pt. Scheduled with Nevin Bloodgood at 11am 09-28-21. ?

## 2021-09-27 NOTE — Telephone Encounter (Signed)
Spoke with patient regarding lab work, and she states she is unable to make it in before 5:00 today; however, will arrive earlier tomorrow prior to appointment to get lab work done. ?

## 2021-09-27 NOTE — Telephone Encounter (Signed)
Labs have been entered for the pt to come in today at our basement lab.   ?

## 2021-09-27 NOTE — Telephone Encounter (Signed)
Dr Marina Goodell there are no appointments for tomorrow. Please advise  ?

## 2021-09-27 NOTE — Telephone Encounter (Signed)
I was contacted by the patient's cardiologist Dr. Valera Castle.  He tells me that the patient has been having incessant diarrhea with 19 pound weight loss after taking antibiotics.  He would like her to be seen ASAP.  Please do the following: ?1.  Appointment with advanced practitioner tomorrow ?2.  Patient needs to come in for CBC, comprehensive metabolic panel, and stool study for C. difficile ? ?Wilhemina Bonito. Eda Keys., M.D. ?[facility] ?Division of Gastroenterology  ?

## 2021-09-28 ENCOUNTER — Encounter: Payer: Self-pay | Admitting: Nurse Practitioner

## 2021-09-28 ENCOUNTER — Other Ambulatory Visit (INDEPENDENT_AMBULATORY_CARE_PROVIDER_SITE_OTHER): Payer: Medicare HMO

## 2021-09-28 ENCOUNTER — Ambulatory Visit: Payer: Medicare HMO | Admitting: Nurse Practitioner

## 2021-09-28 ENCOUNTER — Other Ambulatory Visit: Payer: Self-pay

## 2021-09-28 VITALS — BP 100/64 | HR 73 | Ht 62.0 in | Wt 176.5 lb

## 2021-09-28 DIAGNOSIS — R197 Diarrhea, unspecified: Secondary | ICD-10-CM | POA: Diagnosis not present

## 2021-09-28 LAB — COMPREHENSIVE METABOLIC PANEL
ALT: 19 U/L (ref 0–35)
AST: 21 U/L (ref 0–37)
Albumin: 4.1 g/dL (ref 3.5–5.2)
Alkaline Phosphatase: 62 U/L (ref 39–117)
BUN: 12 mg/dL (ref 6–23)
CO2: 28 mEq/L (ref 19–32)
Calcium: 9.5 mg/dL (ref 8.4–10.5)
Chloride: 103 mEq/L (ref 96–112)
Creatinine, Ser: 0.81 mg/dL (ref 0.40–1.20)
GFR: 74.68 mL/min (ref >60.00)
Glucose, Bld: 115 mg/dL — ABNORMAL HIGH (ref 70–99)
Potassium: 3 mEq/L — ABNORMAL LOW (ref 3.5–5.1)
Sodium: 140 mEq/L (ref 135–145)
Total Bilirubin: 0.8 mg/dL (ref 0.2–1.2)
Total Protein: 6.8 g/dL (ref 6.0–8.3)

## 2021-09-28 LAB — CBC WITH DIFFERENTIAL/PLATELET
Basophils Absolute: 0 10*3/uL (ref 0.0–0.1)
Basophils Relative: 0.5 % (ref 0.0–3.0)
Eosinophils Absolute: 0.1 10*3/uL (ref 0.0–0.7)
Eosinophils Relative: 0.9 % (ref 0.0–5.0)
HCT: 43.8 % (ref 36.0–46.0)
Hemoglobin: 14.5 g/dL (ref 12.0–15.0)
Lymphocytes Relative: 22.6 % (ref 12.0–46.0)
Lymphs Abs: 1.3 10*3/uL (ref 0.7–4.0)
MCHC: 33.2 g/dL (ref 30.0–36.0)
MCV: 102.2 fl — ABNORMAL HIGH (ref 78.0–100.0)
Monocytes Absolute: 0.6 10*3/uL (ref 0.1–1.0)
Monocytes Relative: 10.8 % (ref 3.0–12.0)
Neutro Abs: 3.9 10*3/uL (ref 1.4–7.7)
Neutrophils Relative %: 65.2 % (ref 43.0–77.0)
Platelets: 201 10*3/uL (ref 150.0–400.0)
RBC: 4.28 Mil/uL (ref 3.87–5.11)
RDW: 12.2 % (ref 11.5–15.5)
WBC: 6 10*3/uL (ref 4.0–10.5)

## 2021-09-28 NOTE — Progress Notes (Addendum)
? ? ?ASSESSMENT:   ? ?Patient Profile:  ?Monica Henry is a 66 y.o. female known to Dr. Marina Goodell with a past medical history of GERD with esophagitis and stricture, atrial fibrillation on Eliquis, SSS s/p PPM, hyperlipidemia, anxiety/depression, and alcohol abuse .  Additional medical history as listed in PMH . ? ?# Six week history of diarrhea, multiple episodes a day. Started while on antibiotics but per patient two recent C-diff studies were negative. Cannot correlate onset with medication changes or dietary changes. Given abrupt onset IBD seems unlikely. Microscopic colitis is possible. Infectious etiology high DDx list. On exam she is non-toxic appearing. Abdominal exam is reassuring. VSS.  ? ?# Negative screening colonoscopy 2017 ? ? ?Addendum: Received records from [facility] medical.  Visit 09/20/2021 for diarrhea up to 20 times a day, onset after taking a course of cephalexin.  Per that note C. difficile was negative on 08/31/2021.  Repeat C. difficile toxins A+B EIA on 09/24/2021 was negative.  CRP normal at 6.  AST 49, ALT 37 ? ?PLAN:   ? ?CBC, CMP results ?Submit stool for GI profile panel   ?After submission of stool studies can use Imodium sparingly. Recommend 2 mg BID prn ?Will contact her with lab and stool study results and to get a condition update ?Encouraged to stay hydrated ?She apparently had recent labs through [facility] but I without access to records I do not know if thyroid studies were done.  ? ?History of Present Illness  ? ?Chief Complaint : diarrhea ? ?Patient previously followed by Dr. Marina Goodell for abnormal liver chemistries. Last seen Jan 2022, please refer to that office note. Her liver tests were normal when last checked in late December 2022. She drinks ~ one to two 8 oz glasses of wine a night.   ? ?Interval history :  ?Cardiology contacted [place] yesterday on behalf of patient. She has had several weeks of diarrhea and weight loss.  ? ?Took Cefalexin for a week in February for  possible infection in left breast. Started having diarrhea within a day or two of starting antibiotics. Diarrhea has persisted, having 8-10 BMs a day, including nocturnal stooling. No obvious blood in stool. She doesn't have any associated abdominal cramping / abdominal pain. She has been having nausea/ vomiting but thinks that may be related to GERD. Describes pyrosis and globus. She reports a loss of 17 pounds in 6 weeks. She has fatigue. Per patient she has been check twice for C-diff within the last week ( PCP and also [facility]).  She hasn't taken imodium in last several days because she has mainly been staying at home with access to the bathroom. Also, she says the diarrhea isn't as bad though still going 8-10 times a day ? ?Prior to onset of diarrheal illness Tamela Oddi had been feeling okay.  ? ?Laboratory data: ? ? ?Past Medical History:  ?Diagnosis Date  ? A-fib (HCC)   ? Anal fissure   ? Anxiety   ? Aortic aneurysm (HCC)   ? Atrial flutter (HCC)   ? Depression   ? Hyperlipidemia   ? ? ?Past Surgical History:  ?Procedure Laterality Date  ? ABDOMINAL HYSTERECTOMY    ? BREAST IMPLANT REMOVAL Bilateral   ? CARDIAC ELECTROPHYSIOLOGY MAPPING AND ABLATION    ? CARDIAC PACEMAKER PLACEMENT    ? COLONOSCOPY    ? HIATAL HERNIA REPAIR    ? PLACEMENT OF BREAST IMPLANTS Bilateral   ? TONSILLECTOMY    ? ? ?Current Medications, Allergies,  Family History and Social History were reviewed in [employer] record. ?  ?  ?Current Outpatient Medications  ?Medication Sig Dispense Refill  ? ALPRAZolam (XANAX) 0.25 MG tablet Take 0.25 mg by mouth at bedtime as needed for anxiety.    ? apixaban (ELIQUIS) 5 MG TABS tablet Take 5 mg by mouth 2 (two) times daily.    ? cetirizine (ZYRTEC) 10 MG tablet Take 10 mg by mouth as needed.     ? DULoxetine (CYMBALTA) 60 MG capsule Take 60 mg by mouth every morning.    ? Melatonin 10 MG TABS Take 1 tablet by mouth as needed.     ? metoprolol succinate (TOPROL-XL)  25 MG 24 hr tablet Take 25 mg by mouth daily.    ? omeprazole (PRILOSEC) 40 MG capsule Take 40 mg by mouth daily.    ? promethazine (PHENERGAN) 12.5 MG tablet Take 12.5 mg by mouth every 8 (eight) hours as needed.    ? potassium chloride (KLOR-CON) 10 MEQ tablet Take 1 tablet by mouth daily.    ? REPATHA SURECLICK 140 MG/ML SOAJ Inject 1 mL into the skin every 14 (fourteen) days. (Patient not taking: Reported on 09/28/2021)    ? sotalol (BETAPACE) 120 MG tablet Take 0.5 tablets by mouth in the morning and at bedtime.    ? ?No current facility-administered medications for this visit.  ? ? ?Review of Systems: ?No chest pain. No shortness of breath. No urinary complaints.  ? ?Physical Exam:   ? ?Wt Readings from Last 3 Encounters:  ?09/28/21 176 lb 8 oz (80.1 kg)  ?07/04/20 175 lb (79.4 kg)  ?05/08/20 180 lb (81.6 kg)  ? ? ?BP 100/64   Pulse 73   Ht 5\' 2"  (1.575 m)   Wt 176 lb 8 oz (80.1 kg)   BMI 32.28 kg/m?  ?Constitutional:  Generally well appearing female in no acute distress. ?Psychiatric: Pleasant. Normal mood and affect. Behavior is normal. ?EENT: Pupils normal.  Conjunctivae are normal. No scleral icterus. ?Neck supple.  ?Cardiovascular: Normal rate, regular rhythm. No edema ?Pulmonary/chest: Effort normal and breath sounds normal. No wheezing, rales or rhonchi. ?Abdominal: Soft, nondistended, nontender. Bowel sounds active throughout. There are no masses palpable. No hepatomegaly. ?Neurological: Alert and oriented to person place and time. ?Skin: Skin is warm and dry. No rashes noted. ? ? , NP  09/28/2021, 11:19 AM ? ? ? ? ? ? ? ? ? ?

## 2021-09-28 NOTE — Patient Instructions (Addendum)
Can take Imodium 2 mg twice daily as needed ?Stay will hydrated ?Will contact you with lab and stool study results. ? ?Please return to the lab to see if you need to pick up another stool kit. ? ?Thank you for trusting me with your gastrointestinal care!   ? ?Tye Savoy, NP ? ? ? ? ?BMI: ? ?If you are age 68 or older, your body mass index should be between 23-30. Your Body mass index is 32.28 kg/m?Marland Kitchen If this is out of the aforementioned range listed, please consider follow up with your Primary Care Provider. ? ?If you are age 41 or younger, your body mass index should be between 19-25. Your Body mass index is 32.28 kg/m?Marland Kitchen If this is out of the aformentioned range listed, please consider follow up with your Primary Care Provider.  ? ?MY CHART: ? ?The [place] [facility] providers would like to encourage you to use [facility] to communicate with providers for non-urgent requests or questions.  Due to long hold times on the telephone, sending your provider a message by [facility] may be a faster and more efficient way to get a response.  Please allow 48 business hours for a response.  Please remember that this is for non-urgent requests.  ? ?

## 2021-09-28 NOTE — Progress Notes (Signed)
Thanks for seeing her Gunnar Fusi.  Keep me posted ?

## 2021-10-01 ENCOUNTER — Other Ambulatory Visit: Payer: Self-pay

## 2021-10-01 ENCOUNTER — Telehealth: Payer: Self-pay

## 2021-10-01 DIAGNOSIS — E876 Hypokalemia: Secondary | ICD-10-CM

## 2021-10-01 LAB — GI PROFILE, STOOL, PCR

## 2021-10-01 MED ORDER — FAMOTIDINE 20 MG PO TABS: ORAL_TABLET | ORAL | 3 refills | Status: DC

## 2021-10-01 NOTE — Telephone Encounter (Signed)
Meredith Pel, NP  You 32 minutes ago (5:03 PM)  ? ?Thanks, Looks like she takes daily Omeprazole. Can add a pepcid 20 mg at bedtime for now if needed. Ok now to take Imodium. Please have her take 2 mg TID. Hold for constipation. If not improving then I need to see her back in a couple of weeks, please give her a tentative appt. . Stay hydrated. Call in interim if getting worse. Thanks   ? ? ?Spoke with the patient and discussed this plan. She agrees to the plan of care. ?

## 2021-10-01 NOTE — Telephone Encounter (Signed)
Contacted the patient by phone. Instructed on potassium. She has some in the home and agrees to take 20 mEq daily for 5 days. Return to lab on 10/08/21. ? ?Advised of stool study being negative. The patient reports 6 watery stools today "so far." She had extreme reflux 2 days ago and could not eat. Yesterday and today that is better. She eats small amounts and each time this sends her to the bathroom with diarrhea.  ? ?Any recommendations? ?

## 2021-10-01 NOTE — Telephone Encounter (Signed)
-----   Message from Meredith Pel, NP sent at 10/01/2021 10:43 AM EDT ----- ?[name], please let patient about potassium and send in KDur 20 mEq daily x 5 days and ask her to come in for a bmet in one week. Thanks ?

## 2021-10-02 LAB — FECAL LACTOFERRIN, QUANT
Fecal Lactoferrin: POSITIVE — AB
MICRO NUMBER:: 13207618
SPECIMEN QUALITY:: ADEQUATE

## 2021-10-08 ENCOUNTER — Other Ambulatory Visit (INDEPENDENT_AMBULATORY_CARE_PROVIDER_SITE_OTHER): Payer: Medicare HMO

## 2021-10-08 ENCOUNTER — Telehealth: Payer: Self-pay | Admitting: Nurse Practitioner

## 2021-10-08 DIAGNOSIS — E876 Hypokalemia: Secondary | ICD-10-CM

## 2021-10-08 LAB — BASIC METABOLIC PANEL
BUN: 8 mg/dL (ref 6–23)
CO2: 26 mEq/L (ref 19–32)
Calcium: 9.7 mg/dL (ref 8.4–10.5)
Chloride: 104 mEq/L (ref 96–112)
Creatinine, Ser: 0.73 mg/dL (ref 0.40–1.20)
GFR: 84.59 mL/min (ref >60.00)
Glucose, Bld: 109 mg/dL — ABNORMAL HIGH (ref 70–99)
Potassium: 4.5 mEq/L (ref 3.5–5.1)
Sodium: 138 mEq/L (ref 135–145)

## 2021-10-08 NOTE — Telephone Encounter (Signed)
Pt sent a my chart message as follows: ? ?"Is there any way I may see someone before April 21?  I have lost about 23 pounds since February 18th.  I am still having diarrhea (though  3-4 times a day) and GERD.  The GERD doesn't happen every day, but when it does, it is so bad that I can hardly eat and many times I get the dry heaves.  The past two weeks I have had several  good days, and then several bad days and I can't figure out any reason for the changes.  I would like to have a colonoscopy and endoscopy to make sure nothing is internally wrong.  I am coming for blood work this morning (10/08/21) to check my potassium and doubt anyone can talk to me, but please call my phone if someone can.  Thank you. ?  ?Monica Henry" ?

## 2021-10-08 NOTE — Telephone Encounter (Signed)
Got an appointment for her. Opening on Monica Henry's schedule tomorrow. Patient notified and accepts this appointment. ?

## 2021-10-08 NOTE — Telephone Encounter (Signed)
You could use one of the afternoon appts that is held on his schedule. ?

## 2021-10-09 ENCOUNTER — Telehealth: Payer: Self-pay

## 2021-10-09 ENCOUNTER — Ambulatory Visit: Payer: Medicare HMO | Admitting: Nurse Practitioner

## 2021-10-09 ENCOUNTER — Encounter: Payer: Self-pay | Admitting: Internal Medicine

## 2021-10-09 ENCOUNTER — Encounter: Payer: Self-pay | Admitting: Nurse Practitioner

## 2021-10-09 VITALS — BP 110/80 | HR 68 | Ht 62.0 in | Wt 174.0 lb

## 2021-10-09 DIAGNOSIS — K2100 Gastro-esophageal reflux disease with esophagitis, without bleeding: Secondary | ICD-10-CM

## 2021-10-09 DIAGNOSIS — R197 Diarrhea, unspecified: Secondary | ICD-10-CM

## 2021-10-09 NOTE — Telephone Encounter (Signed)
Letter printed and faxed

## 2021-10-09 NOTE — Progress Notes (Signed)
Assessment and plan as noted 

## 2021-10-09 NOTE — Telephone Encounter (Signed)
[facility] Pre-operative Risk Assessment  ?   ?Monica Henry ?September 15, 1955 ?[id number] ? ?Procedure: Colonoscopy/EGD ?Anesthesia type:  MAC ?Procedure Date: 10/18/21 ?Provider: Dr. Henrene Pastor ? ?Type of Clearance needed: Pharmacy/Cardiac ? ?Medication(s) needing held: Eliquis  ? ?Length of time for medication to be held: 2 day ? ?Please review request and advise by either responding to this message or by sending your response to the fax # provided below. ? ?Thank you, ? ?[facility]  ?Phone: [phone] ?Fax: [phone] ?ATTENTION: Beanca Kiester, CMA ? ?

## 2021-10-09 NOTE — Patient Instructions (Signed)
You have been scheduled for an EGD and Colonoscopy. Please follow the written instructions given to you at your visit today. ?If you use inhalers (even only as needed), please bring them with you on the day of your procedure. ? ?You will be contacted by our office prior to your procedure for directions on holding your Eliquis.  If you do not hear from our office 1 week prior to your scheduled procedure, please call [phone] to discuss.  ? ? ?Thank you for trusting me with your gastrointestinal care!   ? ?Willette Cluster, NP ? ? ?BMI: ? ?If you are age 68 or older, your body mass index should be between 23-30. Your Body mass index is 31.83 kg/m?Marland Kitchen If this is out of the aforementioned range listed, please consider follow up with your Primary Care Provider. ? ?If you are age 14 or younger, your body mass index should be between 19-25. Your Body mass index is 31.83 kg/m?Marland Kitchen If this is out of the aformentioned range listed, please consider follow up with your Primary Care Provider.  ? ?MY CHART: ? ?The [place] [facility] providers would like to encourage you to use [facility] to communicate with providers for non-urgent requests or questions.  Due to long hold times on the telephone, sending your provider a message by [facility] may be a faster and more efficient way to get a response.  Please allow 48 business hours for a response.  Please remember that this is for non-urgent requests.  ? ?

## 2021-10-09 NOTE — Progress Notes (Signed)
? ? ? ?Assessment  ? ?Patient Profile:  ?Monica Henry is a 66 y.o. female known to Dr. Henrene Pastor with a past medical history of GERD with esophagitis and stricture, atrial fibrillation on Eliquis, SSS s/p PPM, hyperlipidemia, anxiety/depression, and alcohol abuse .  Additional medical history as listed in [place] . ? ?Persistent diarrhea with reported weight loss of 20 pounds over last 8 weeks. Negative GI path panel ( which includes C-diff and giardia among other pathogens) and negative C-diff x2 at outside facilities.  Having 6-8 episodes a day including nocturnal diarrhea. Infectious etiology now seeming less likely. 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 Another consideration is microscopic colitis . IBD seems unlikely ? ?GERD. Having breakthrough pyrosis, regurgitation, globus sensation on daily PPI. ? ?Chronic pill dysphagia.  ? ?Chronic anticoagulation, on Eliquis. Hold Eliquis for 2 days before procedures - will instruct when and how to resume after procedure. Patient understands that there is a low but real risk of cardiovascular event such as heart attack, stroke, or embolism /  thrombosis while off blood thinner. The patient consents to proceed. Will communicate by phone or EMR with patient's prescribing provider to confirm that holding Eliquis is reasonable in this case.  ? ?Negative screening colonoscopy May 2017 - [place] ? ?Plan  ?She will call her PCP to see when last time thyroid studies were done. If not done within last few months would recommend thyroid labs. We are happy to order if she wants to return here.  ?For evaluation of ongoing diarrhea will arrange for a colonoscopy. The risks and benefits of colonoscopy with possible polypectomy / biopsies were discussed and the patient agrees to proceed.  ?For breakthrough GERD symptoms will arrange for an EGD to be done at time of colonoscopy. The risks and benefits of EGD with possible biopsies were discussed with the patient who agrees to proceed.   ?Continue anti-reflux measures  ?She never did start the bedtime Pepcid recommended a few days ago through [employer] She did increase PPI to BID yesterday. Continue BID PPI for now.  ? ? ?History of Present Illness  ? ?Chief Complaint : diarrhea and acid reflus ? ?I saw [name] the end of March with a 6 weeks history of severe diarrhea.  She had taken antibiotics and initial thought was that this was C. difficile but two different specimens ( outside facility) were negative. I obtained a full GI path panel which was also negative. Despite this it seemed diarrhea was probably infectious so we continued supportive care and made a follow-up visit.  In the meantime patient has been in contact with the office.  She is still having diarrhea and also breakthrough reflux symptoms. Through [employer] I had recommended she continue am PPI and added Pepcid at night. Then, for persistent symptoms I temporarily increased PPI to BID  ? ?Interval History:  ? ?GERD:  ?She actually had not started the Pepcid at bedtime (she didn't realize she was suppose to a pharmacy advised against it). She did increase the prilosec to BID for the first time last night.  She is following anti-reflux measures as we discussed. She has globus sensation, heartburn and regurgitation. Also having problems with pills getting stuck in her esophagus. She reports that a barium swallow done in [place] a couple of years ago showed " a place in esophagus where pills got stuck". She doesn't have problems swallowing food ? ?Diarrhea:  ?When I saw her in March she was having ~ 20 loose BMs a day.  Still having diarrhea now taking imodium and now having 6-8 episodes a day.  Diarrhea is not just postprandial, having nocturnal BMs as well. We talked about any potential medications as culprit at our last visit. She did start Rapatha prior to onset of diarrhea but she held it for a month without any significant improvement. She has no associated abdominal pain.  Her appetite is poor but no nausea or vomiting. Since diarrheal illness started about 8 weeks ago she has lost ~ 20 pounds. Currently weight is stable.  ? ?Laboratory data: ? ?  Latest Ref Rng & Units 09/28/2021  ? 10:30 AM 07/04/2020  ?  3:35 PM 06/02/2020  ?  1:34 PM  ?Hepatic Function  ?Total Protein 6.0 - 8.3 g/dL 6.8   7.3   7.2    ?Albumin 3.5 - 5.2 g/dL 4.1   4.2   4.1    ?AST 0 - 37 U/L 21   32   53    ?ALT 0 - 35 U/L 19   24   56    ?Alk Phosphatase 39 - 117 U/L 62   77   80    ?Total Bilirubin 0.2 - 1.2 mg/dL 0.8   0.9   1.2    ?Bilirubin, Direct 0.0 - 0.3 mg/dL   0.3    ? ? ? ?Previous GI Evaluations  ? ?Endoscopies:  ?Screening colonoscopy May 2017 at [facility] - Dr. Sallee Lange.  ?- negative ? ? ?Past Medical History:  ?Diagnosis Date  ? A-fib ([place])   ? Anal fissure   ? Anxiety   ? Aortic aneurysm ([place])   ? Atrial flutter ([place])   ? Depression   ? Hyperlipidemia   ? ? ?Past Surgical History:  ?Procedure Laterality Date  ? ABDOMINAL HYSTERECTOMY    ? BREAST IMPLANT REMOVAL Bilateral   ? CARDIAC ELECTROPHYSIOLOGY MAPPING AND ABLATION    ? CARDIAC PACEMAKER PLACEMENT    ? COLONOSCOPY    ? HIATAL HERNIA REPAIR    ? PLACEMENT OF BREAST IMPLANTS Bilateral   ? TONSILLECTOMY    ? ? ?Current Medications, Allergies, Family History and Social History were reviewed in [employer] record. ?  ?  ?Current Outpatient Medications  ?Medication Sig Dispense Refill  ? ALPRAZolam (XANAX) 0.25 MG tablet Take 0.25 mg by mouth at bedtime as needed for anxiety.    ? apixaban (ELIQUIS) 5 MG TABS tablet Take 5 mg by mouth 2 (two) times daily.    ? cetirizine (ZYRTEC) 10 MG tablet Take 10 mg by mouth as needed.     ? DULoxetine (CYMBALTA) 60 MG capsule Take 60 mg by mouth every morning.    ? famotidine (PEPCID) 20 MG tablet One tablet evenings as needed for stomach acid and reflux 30 tablet 3  ? Melatonin 10 MG TABS Take 1 tablet by mouth as needed.     ? metoprolol succinate (TOPROL-XL) 25 MG 24 hr  tablet Take 25 mg by mouth daily.    ? omeprazole (PRILOSEC) 40 MG capsule Take 40 mg by mouth daily.    ? potassium chloride (KLOR-CON) 10 MEQ tablet Take 1 tablet by mouth daily.    ? promethazine (PHENERGAN) 12.5 MG tablet Take 12.5 mg by mouth every 8 (eight) hours as needed.    ? REPATHA SURECLICK 865 MG/ML SOAJ Inject 1 mL into the skin every 14 (fourteen) days. (Patient not taking: Reported on 09/28/2021)    ? sotalol (BETAPACE) 120 MG tablet  Take 0.5 tablets by mouth in the morning and at bedtime.    ? ?No current facility-administered medications for this visit.  ? ? ?Review of Systems: ?No chest pain. No shortness of breath. No urinary complaints.  ? ? ?Physical Exam  ? ?Wt Readings from Last 3 Encounters:  ?09/28/21 176 lb 8 oz (80.1 kg)  ?07/04/20 175 lb (79.4 kg)  ?05/08/20 180 lb (81.6 kg)  ? ? ?BP 110/80   Pulse 68   Ht $R'5\' 2"'rG$  (1.575 m)   Wt 174 lb (78.9 kg)   SpO2 97%   BMI 31.83 kg/m?  ?Constitutional:  Generally well appearing female in no acute distress. ?Psychiatric: Pleasant. Normal mood and affect. Behavior is normal. ?EENT: Pupils normal.  Conjunctivae are normal. No scleral icterus. ?Neck supple.  ?Cardiovascular: Normal rate, no edema ?Pulmonary/chest: Effort normal and breath sounds normal. No wheezing, rales or rhonchi. ?Abdominal: Soft, nondistended, nontender. Bowel sounds active throughout. There are no masses palpable. No hepatomegaly. ?Neurological: Alert and oriented to person place and time. ?Skin: Skin is warm and dry. No rashes noted. ? ?Tye Savoy, NP  10/09/2021, 8:43 AM ? ? ? ? ? ? ? ? ? ?

## 2021-10-10 ENCOUNTER — Telehealth: Payer: Self-pay | Admitting: Nurse Practitioner

## 2021-10-10 NOTE — Telephone Encounter (Signed)
Patient called requesting an order for thyroid labs. Please advise ?

## 2021-10-10 NOTE — Telephone Encounter (Signed)
Pt states thyroid labs were completed years ago. Let pt know we can place the orders but pt requested that labs be faxed to PCP in [place] because that would be closer for her. Which labs need to be sent?  ?

## 2021-10-10 NOTE — Telephone Encounter (Signed)
See 10/10/21 telephone encounter. This has been addressed. ?

## 2021-10-11 ENCOUNTER — Other Ambulatory Visit (INDEPENDENT_AMBULATORY_CARE_PROVIDER_SITE_OTHER): Payer: Medicare HMO

## 2021-10-11 ENCOUNTER — Other Ambulatory Visit: Payer: Self-pay

## 2021-10-11 DIAGNOSIS — R5382 Chronic fatigue, unspecified: Secondary | ICD-10-CM

## 2021-10-11 LAB — TSH: TSH: 1.72 u[IU]/mL (ref 0.35–5.50)

## 2021-10-12 NOTE — Telephone Encounter (Signed)
Spoke to patient, she said Dr. Bernette Mayers prescribes this. ? ?Dr. Loraine Leriche D. Bernette Mayers, MD ?Address: [address], [place], [place] [address] ?Phone: [phone] ?Fax number [phone] ? ?Letter faxed to correct doctor. ?

## 2021-10-15 NOTE — Telephone Encounter (Signed)
Fax received giving permission to hold 2 days before. Patient notified by identified VM and MyChart message. ?

## 2021-10-15 NOTE — Telephone Encounter (Signed)
Clearance still not received. I called Dr. Chuck Hint office and left a VM with the nurse to call back with clearance or fax it. ?

## 2021-10-18 ENCOUNTER — Encounter: Payer: Self-pay | Admitting: Internal Medicine

## 2021-10-18 ENCOUNTER — Ambulatory Visit (AMBULATORY_SURGERY_CENTER): Payer: Medicare HMO | Admitting: Internal Medicine

## 2021-10-18 VITALS — BP 133/85 | HR 70 | Temp 97.7°F | Resp 8 | Ht 62.0 in | Wt 174.0 lb

## 2021-10-18 DIAGNOSIS — K52832 Lymphocytic colitis: Secondary | ICD-10-CM

## 2021-10-18 DIAGNOSIS — K449 Diaphragmatic hernia without obstruction or gangrene: Secondary | ICD-10-CM | POA: Diagnosis not present

## 2021-10-18 DIAGNOSIS — K222 Esophageal obstruction: Secondary | ICD-10-CM

## 2021-10-18 DIAGNOSIS — R197 Diarrhea, unspecified: Secondary | ICD-10-CM

## 2021-10-18 DIAGNOSIS — R1310 Dysphagia, unspecified: Secondary | ICD-10-CM | POA: Diagnosis not present

## 2021-10-18 DIAGNOSIS — K2100 Gastro-esophageal reflux disease with esophagitis, without bleeding: Secondary | ICD-10-CM

## 2021-10-18 MED ORDER — SODIUM CHLORIDE 0.9 % IV SOLN: 500.0000 mL | Freq: Once | INTRAVENOUS | Status: DC

## 2021-10-18 NOTE — Op Note (Signed)
[facility] ?Patient Name: Monica Henry ?Procedure Date: 10/18/2021 12:32 PM ?MRN: [id number] ?Endoscopist: Wilhemina Bonito. Marina Goodell , MD ?Age: 66 ?Referring MD:  ?Date of Birth: 02-13-1956 ?Gender: Female ?Account #: [id number] ?Procedure:                Colonoscopy with biopsies ?Indications:              Clinically significant diarrhea of unexplained  ?                          origin, Weight loss. Previous colonoscopy elsewhere  ?                          2017 was unremarkable ?Medicines:                Monitored Anesthesia Care ?Procedure:                Pre-Anesthesia Assessment: ?                          - Prior to the procedure, a History and Physical  ?                          was performed, and patient medications and  ?                          allergies were reviewed. The patient's tolerance of  ?                          previous anesthesia was also reviewed. The risks  ?                          and benefits of the procedure and the sedation  ?                          options and risks were discussed with the patient.  ?                          All questions were answered, and informed consent  ?                          was obtained. Prior Anticoagulants: The patient has  ?                          taken Eliquis (apixaban), last dose was 3 days  ?                          prior to procedure. ASA Grade Assessment: III - A  ?                          patient with severe systemic disease. After  ?                          reviewing the risks and benefits, the patient was  ?  deemed in satisfactory condition to undergo the  ?                          procedure. ?                          After obtaining informed consent, the colonoscope  ?                          was passed under direct vision. Throughout the  ?                          procedure, the patient's blood pressure, pulse, and  ?                          oxygen saturations were monitored continuously. The  ?                           CF [id number] #[id number] was introduced through the anus  ?                          and advanced to the the cecum, identified by  ?                          appendiceal orifice and ileocecal valve. The  ?                          ileocecal valve, appendiceal orifice, and rectum  ?                          were photographed. The quality of the bowel  ?                          preparation was excellent. The colonoscopy was  ?                          performed without difficulty. The patient tolerated  ?                          the procedure well. The bowel preparation used was  ?                          SUPREP via split dose instruction. ?Scope In: 1:27:51 PM ?Scope Out: 1:44:16 PM ?Scope Withdrawal Time: 0 hours 11 minutes 25 seconds  ?Total Procedure Duration: 0 hours 16 minutes 25 seconds  ?Findings:                 A few diverticula were found in the sigmoid colon. ?                          The entire examined colon appeared normal on direct  ?                          and retroflexion views. Biopsies for histology were  ?  taken with a cold forceps from the entire colon for  ?                          evaluation of microscopic colitis. ?Complications:            No immediate complications. Estimated blood loss:  ?                          None. ?Estimated Blood Loss:     Estimated blood loss: none. ?Impression:               - Diverticulosis in the sigmoid colon. ?                          - The entire examined colon is normal on direct and  ?                          retroflexion views. ?Recommendation:           - Repeat colonoscopy in 10 years for screening  ?                          purposes. ?                          - Patient has a contact number available for  ?                          emergencies. The signs and symptoms of potential  ?                          delayed complications were discussed with the  ?                          patient. Return to normal  activities tomorrow.  ?                          Written discharge instructions were provided to the  ?                          patient. ?                          - Resume previous diet. ?                          - Continue present medications. ?                          - Await pathology results. We will contact you with  ?                          the results and further recommendations. ?                          ??? Okay to use Imodium if necessary ?Wilhemina Bonito. Marina Goodell, MD ?10/18/2021 1:51:37 PM ?This report has been signed  electronically. ?

## 2021-10-18 NOTE — Op Note (Signed)
[facility] ?Patient Name: Monica Henry ?Procedure Date: 10/18/2021 12:31 PM ?MRN: [id number] ?Endoscopist: Wilhemina BonitoJohn N. Marina GoodellPerry , MD ?Age: [age] ?Referring MD:  ?Date of Birth: 01/09/1956 ?Gender: Female ?Account #: [id number] ?Procedure:                Upper GI endoscopy with biopsies ?Indications:              Dysphagia (cervical pill), Diarrhea ?Medicines:                Monitored Anesthesia Care ?Procedure:                Pre-Anesthesia Assessment: ?                          - Prior to the procedure, a History and Physical  ?                          was performed, and patient medications and  ?                          allergies were reviewed. The patient's tolerance of  ?                          previous anesthesia was also reviewed. The risks  ?                          and benefits of the procedure and the sedation  ?                          options and risks were discussed with the patient.  ?                          All questions were answered, and informed consent  ?                          was obtained. Prior Anticoagulants: The patient has  ?                          taken Eliquis (apixaban), last dose was 3 days  ?                          prior to procedure. ASA Grade Assessment: III - A  ?                          patient with severe systemic disease. After  ?                          reviewing the risks and benefits, the patient was  ?                          deemed in satisfactory condition to undergo the  ?                          procedure. ?  After obtaining informed consent, the endoscope was  ?                          passed under direct vision. Throughout the  ?                          procedure, the patient's blood pressure, pulse, and  ?                          oxygen saturations were monitored continuously. The  ?                          Endoscope was introduced through the mouth, and  ?                          advanced to the third part of duodenum.  The upper  ?                          GI endoscopy was accomplished without difficulty.  ?                          The patient tolerated the procedure well. ?Scope In: ?Scope Out: ?Findings:                 The esophagus was normal, save large caliber ring  ?                          at the gastroesophageal junction (35 cm). ?                          The stomach was normal, save sliding hiatal hernia. ?                          The examined duodenum was normal. Biopsies were  ?                          taken with a cold forceps for histology to evaluate  ?                          for causes of chronic diarrhea. ?                          The cardia and gastric fundus were normal on  ?                          retroflexion. ?Complications:            No immediate complications. ?Estimated Blood Loss:     Estimated blood loss: none. ?Impression:               - Normal esophagus save large caliber distal ring. ?                          - Normal stomach save hiatal hernia. ?                          -  Normal examined duodenum. Biopsied. ?Recommendation:           - Patient has a contact number available for  ?                          emergencies. The signs and symptoms of potential  ?                          delayed complications were discussed with the  ?                          patient. Return to normal activities tomorrow.  ?                          Written discharge instructions were provided to the  ?                          patient. ?                          - Resume previous diet. ?                          - Continue present medications. Resume Eliquis today ?                          - Await pathology results. We will contact you with  ?                          the results. ?Wilhemina Bonito. Marina Goodell, MD ?10/18/2021 2:09:13 PM ?This report has been signed electronically. ?

## 2021-10-18 NOTE — Progress Notes (Signed)
Called to room to assist during endoscopic procedure.  Patient ID and intended procedure confirmed with present staff. Received instructions for my participation in the procedure from the performing physician.  

## 2021-10-18 NOTE — Progress Notes (Signed)
Assessment  ?  ?Patient Profile:  ?Monica Henry is a 66 y.o. female known to Dr. Henrene Pastor with a past medical history of GERD with esophagitis and stricture, atrial fibrillation on Eliquis, SSS s/p PPM, hyperlipidemia, anxiety/depression, and alcohol abuse .  Additional medical history as listed in [place] . ?  ?Persistent diarrhea with reported weight loss of 20 pounds over last 8 weeks. Negative GI path panel ( which includes C-diff and giardia among other pathogens) and negative C-diff x2 at outside facilities.  Having 6-8 episodes a day including nocturnal diarrhea. Infectious etiology now seeming less likely. 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 Another consideration is microscopic colitis . IBD seems unlikely ?  ?GERD. Having breakthrough pyrosis, regurgitation, globus sensation on daily PPI. ?  ?Chronic pill dysphagia.  ?  ?Chronic anticoagulation, on Eliquis. Hold Eliquis for 2 days before procedures - will instruct when and how to resume after procedure. Patient understands that there is a low but real risk of cardiovascular event such as heart attack, stroke, or embolism /  thrombosis while off blood thinner. The patient consents to proceed. Will communicate by phone or EMR with patient's prescribing provider to confirm that holding Eliquis is reasonable in this case.  ?  ?Negative screening colonoscopy May 2017 - [place] ?  ?Plan  ?She will call her PCP to see when last time thyroid studies were done. If not done within last few months would recommend thyroid labs. We are happy to order if she wants to return here.  ?For evaluation of ongoing diarrhea will arrange for a colonoscopy. The risks and benefits of colonoscopy with possible polypectomy / biopsies were discussed and the patient agrees to proceed.  ?For breakthrough GERD symptoms will arrange for an EGD to be done at time of colonoscopy. The risks and benefits of EGD with possible biopsies were discussed with the patient who agrees to proceed.   ?Continue anti-reflux measures  ?She never did start the bedtime Pepcid recommended a few days ago through [employer] She did increase PPI to BID yesterday. Continue BID PPI for now.  ?  ?  ?History of Present Illness  ?  ?Chief Complaint : diarrhea and acid reflus ?  ?I saw Monica Henry the end of March with a 6 weeks history of severe diarrhea.  She had taken antibiotics and initial thought was that this was C. difficile but two different specimens ( outside facility) were negative. I obtained a full GI path panel which was also negative. Despite this it seemed diarrhea was probably infectious so we continued supportive care and made a follow-up visit.  In the meantime patient has been in contact with the office.  She is still having diarrhea and also breakthrough reflux symptoms. Through [employer] I had recommended she continue am PPI and added Pepcid at night. Then, for persistent symptoms I temporarily increased PPI to BID  ?  ?Interval History:  ?  ?GERD:  ?She actually had not started the Pepcid at bedtime (she didn't realize she was suppose to a pharmacy advised against it). She did increase the prilosec to BID for the first time last night.  She is following anti-reflux measures as we discussed. She has globus sensation, heartburn and regurgitation. Also having problems with pills getting stuck in her esophagus. She reports that a barium swallow done in [place] a couple of years ago showed " a place in esophagus where pills got stuck". She doesn't have problems swallowing food ?  ?Diarrhea:  ?When I saw her  in March she was having ~ 20 loose BMs a day. Still having diarrhea now taking imodium and now having 6-8 episodes a day.  Diarrhea is not just postprandial, having nocturnal BMs as well. We talked about any potential medications as culprit at our last visit. She did start Rapatha prior to onset of diarrhea but she held it for a month without any significant improvement. She has no associated abdominal  pain. Her appetite is poor but no nausea or vomiting. Since diarrheal illness started about 8 weeks ago she has lost ~ 20 pounds. Currently weight is stable.  ?  ?Laboratory data: ?  ?  Latest Ref Rng & Units 09/28/2021  ? 10:30 AM 07/04/2020  ?  3:35 PM 06/02/2020  ?  1:34 PM  ?Hepatic Function  ?Total Protein 6.0 - 8.3 g/dL 6.8   7.3   7.2    ?Albumin 3.5 - 5.2 g/dL 4.1   4.2   4.1    ?AST 0 - 37 U/L 21   32   53    ?ALT 0 - 35 U/L 19   24   56    ?Alk Phosphatase 39 - 117 U/L 62   77   80    ?Total Bilirubin 0.2 - 1.2 mg/dL 0.8   0.9   1.2    ?Bilirubin, Direct 0.0 - 0.3 mg/dL     0.3    ?  ?  ?  ?Previous GI Evaluations  ?  ?Endoscopies:  ?Screening colonoscopy May 2017 at [facility] - Dr. Sallee Lange.  ?- negative ?  ?  ?    ?Past Medical History:  ?Diagnosis Date  ? A-fib ([place])    ? Anal fissure    ? Anxiety    ? Aortic aneurysm ([place])    ? Atrial flutter ([place])    ? Depression    ? Hyperlipidemia    ?  ?  ?     ?Past Surgical History:  ?Procedure Laterality Date  ? ABDOMINAL HYSTERECTOMY      ? BREAST IMPLANT REMOVAL Bilateral    ? CARDIAC ELECTROPHYSIOLOGY MAPPING AND ABLATION      ? CARDIAC PACEMAKER PLACEMENT      ? COLONOSCOPY      ? HIATAL HERNIA REPAIR      ? PLACEMENT OF BREAST IMPLANTS Bilateral    ? TONSILLECTOMY      ?  ?  ?Current Medications, Allergies, Family History and Social History were reviewed in [employer] record. ?  ?  ?      ?Current Outpatient Medications  ?Medication Sig Dispense Refill  ? ALPRAZolam (XANAX) 0.25 MG tablet Take 0.25 mg by mouth at bedtime as needed for anxiety.      ? apixaban (ELIQUIS) 5 MG TABS tablet Take 5 mg by mouth 2 (two) times daily.      ? cetirizine (ZYRTEC) 10 MG tablet Take 10 mg by mouth as needed.       ? DULoxetine (CYMBALTA) 60 MG capsule Take 60 mg by mouth every morning.      ? famotidine (PEPCID) 20 MG tablet One tablet evenings as needed for stomach acid and reflux 30 tablet 3  ? Melatonin 10 MG TABS Take 1 tablet by  mouth as needed.       ? metoprolol succinate (TOPROL-XL) 25 MG 24 hr tablet Take 25 mg by mouth daily.      ? omeprazole (PRILOSEC) 40 MG capsule Take 40 mg  by mouth daily.      ? potassium chloride (KLOR-CON) 10 MEQ tablet Take 1 tablet by mouth daily.      ? promethazine (PHENERGAN) 12.5 MG tablet Take 12.5 mg by mouth every 8 (eight) hours as needed.      ? REPATHA SURECLICK 388 MG/ML SOAJ Inject 1 mL into the skin every 14 (fourteen) days. (Patient not taking: Reported on 09/28/2021)      ? sotalol (BETAPACE) 120 MG tablet Take 0.5 tablets by mouth in the morning and at bedtime.      ?  ?No current facility-administered medications for this visit.  ?  ?  ?Review of Systems: ?No chest pain. No shortness of breath. No urinary complaints.  ?  ?  ?Physical Exam  ?  ?   ?Wt Readings from Last 3 Encounters:  ?09/28/21 176 lb 8 oz (80.1 kg)  ?07/04/20 175 lb (79.4 kg)  ?05/08/20 180 lb (81.6 kg)  ?  ?  ?BP 110/80   Pulse 68   Ht $R'5\' 2"'Vz$  (1.575 m)   Wt 174 lb (78.9 kg)   SpO2 97%   BMI 31.83 kg/m?  ?Constitutional:  Generally well appearing female in no acute distress. ?Psychiatric: Pleasant. Normal mood and affect. Behavior is normal. ?EENT: Pupils normal.  Conjunctivae are normal. No scleral icterus. ?Neck supple.  ?Cardiovascular: Normal rate, no edema ?Pulmonary/chest: Effort normal and breath sounds normal. No wheezing, rales or rhonchi. ?Abdominal: Soft, nondistended, nontender. Bowel sounds active throughout. There are no masses palpable. No hepatomegaly. ?Neurological: Alert and oriented to person place and time. ?Skin: Skin is warm and dry. No rashes noted. ?  ?Tye Savoy, NP  10/09/2021, 8:43 AM ?  ?  ?  ?

## 2021-10-18 NOTE — Progress Notes (Signed)
Pt in recovery with monitors in place, VSS. Report given to receiving RN. Bite guard was placed with pt awake to ensure comfort. No dental or soft tissue damage noted. 

## 2021-10-18 NOTE — Patient Instructions (Signed)
Thank you for coming in to see [place] today. ?Resume previous diet today. ?Resume your  medications today, including  Eliquis. ?Return to normal daily activities tomorrow. ?Recommend next screening colonoscopy in 10 years. ? ? ?YOU HAD AN ENDOSCOPIC PROCEDURE TODAY AT THE [facility]:   Refer to the procedure report that was given to you for any specific questions about what was found during the examination.  If the procedure report does not answer your questions, please call your gastroenterologist to clarify.  If you requested that your care partner not be given the details of your procedure findings, then the procedure report has been included in a sealed envelope for you to review at your convenience later. ? ?YOU SHOULD EXPECT: Some feelings of bloating in the abdomen. Passage of more gas than usual.  Walking can help get rid of the air that was put into your GI tract during the procedure and reduce the bloating. If you had a lower endoscopy (such as a colonoscopy or flexible sigmoidoscopy) you may notice spotting of blood in your stool or on the toilet paper. If you underwent a bowel prep for your procedure, you may not have a normal bowel movement for a few days. ? ?Please Note:  You might notice some irritation and congestion in your nose or some drainage.  This is from the oxygen used during your procedure.  There is no need for concern and it should clear up in a day or so. ? ?SYMPTOMS TO REPORT IMMEDIATELY: ? ?Following lower endoscopy (colonoscopy or flexible sigmoidoscopy): ? Excessive amounts of blood in the stool ? Significant tenderness or worsening of abdominal pains ? Swelling of the abdomen that is new, acute ? Fever of 100?F or higher ? ?Following upper endoscopy (EGD) ? Vomiting of blood or coffee ground material ? New chest pain or pain under the shoulder blades ? Painful or persistently difficult swallowing ? New shortness of breath ? Fever of 100?F or higher ? Black, tarry-looking  stools ? ?For urgent or emergent issues, a gastroenterologist can be reached at any hour by calling [phone]. ?Do not use MyChart messaging for urgent concerns.  ? ? ?DIET:  We do recommend a small meal at first, but then you may proceed to your regular diet.  Drink plenty of fluids but you should avoid alcoholic beverages for 24 hours. ? ?ACTIVITY:  You should plan to take it easy for the rest of today and you should NOT DRIVE or use heavy machinery until tomorrow (because of the sedation medicines used during the test).   ? ?FOLLOW UP: ?Our staff will call the number listed on your records 48-72 hours following your procedure to check on you and address any questions or concerns that you may have regarding the information given to you following your procedure. If we do not reach you, we will leave a message.  We will attempt to reach you two times.  During this call, we will ask if you have developed any symptoms of COVID 19. If you develop any symptoms (ie: fever, flu-like symptoms, shortness of breath, cough etc.) before then, please call [phone].  If you test positive for Covid 19 in the 2 weeks post procedure, please call and report this information to [place].   ? ?If any biopsies were taken you will be contacted by phone or by letter within the next 1-3 weeks.  Please call us at [phone] if you have not heard about the biopsies in 3 weeks.  ? ? ?  SIGNATURES/CONFIDENTIALITY: ?You and/or your care partner have signed paperwork which will be entered into your electronic medical record.  These signatures attest to the fact that that the information above on your After Visit Summary has been reviewed and is understood.  Full responsibility of the confidentiality of this discharge information lies with you and/or your care-partner.  ?

## 2021-10-19 ENCOUNTER — Ambulatory Visit: Payer: Medicare HMO | Admitting: Nurse Practitioner

## 2021-10-22 ENCOUNTER — Telehealth: Payer: Self-pay | Admitting: *Deleted

## 2021-10-22 ENCOUNTER — Telehealth: Payer: Self-pay

## 2021-10-22 DIAGNOSIS — K2100 Gastro-esophageal reflux disease with esophagitis, without bleeding: Secondary | ICD-10-CM

## 2021-10-22 NOTE — Telephone Encounter (Signed)
Monica Henry, ?Have her increase omeprazole to 40 mg BID. ?Thanks  ?

## 2021-10-22 NOTE — Telephone Encounter (Signed)
Excellent.  ?No additional changes. ?Thanks for the update  ?

## 2021-10-22 NOTE — Telephone Encounter (Signed)
Called pt to advise to increase the omeprazole to 40mg  BID and she states that she has already been taking the Omeprazole 40mg  BID AC for about 2-3 weeks as recommended by a nurse she spoke with at [facility]. She is also taking Famotidine 20mg  QHS. RN encouraged pt to continue taking the medications as she has been. She did report that the Tums helped a little and so far she has been able to eat a few saltine crackers. Instructed pt that RN would make Dr. aware that she has already been taking the increased dose of Omeprazole and see if there are any further recommendations at this time while we await pathology results. RN instructed that a nurse would call her back tomorrow if there were any new recommendations.

## 2021-10-22 NOTE — Telephone Encounter (Signed)
Attempted f/u call. No answer, left VM. 

## 2021-10-22 NOTE — Telephone Encounter (Signed)
?  Follow up Call- ? ? ?  10/18/2021  ? 12:53 PM  ?Call back number  ?Post procedure Call Back phone  # [phone]  ?Permission to leave phone message Yes  ?  ? ?Patient questions: ? ?Do you have a fever, pain , or abdominal swelling? No. ?Pain Score  0 * ? ?Have you tolerated food without any problems? No: pt states she had been eating fine the past few days but the reflux has returned today and is severe like it has been. She has been unable to eat and vomited bile liquid this morning d/t the reflux. She is still taking her medications as prescribed for reflux. States she tries to eat a bland diet to help minimize reflux. States she had some of a hamburger and a tomato last night for dinner. She is aware that tomatoes can cause reflux and normally tries to avoid these types of foods. RN suggested to try taking Tums to see if that helps her symptoms. Reassured pt that the MD is waiting on biopsy results. Instructed that if Dr. Marina Goodell had any further recommendations that we will be in touch with her.  ? ?Have you been able to return to your normal activities? Yes.   ? ?Do you have any questions about your discharge instructions: ?Diet   No. ?Medications  No. ?Follow up visit  No. ? ?Do you have questions or concerns about your Care? Yes.  - pt asking if she should have her gallbladder looked at since she continues to have symptoms of reflux despite medications and normal appearing EGD. RN stated that she would include that question for the MD when routing this call.  ? ?Actions: ?* If pain score is 4 or above: ?Physician/ provider Notified : Yancey Flemings, MD. ? ? ?

## 2021-10-23 NOTE — Telephone Encounter (Signed)
Noted  

## 2021-10-25 ENCOUNTER — Telehealth: Payer: Self-pay | Admitting: Internal Medicine

## 2021-10-25 ENCOUNTER — Other Ambulatory Visit: Payer: Self-pay

## 2021-10-25 ENCOUNTER — Encounter: Payer: Self-pay | Admitting: Internal Medicine

## 2021-10-25 MED ORDER — BUDESONIDE 3 MG PO CPEP: 3.0000 mg | ORAL_CAPSULE | Freq: Every day | ORAL | 3 refills | Status: DC

## 2021-10-25 NOTE — Telephone Encounter (Signed)
See result note.  

## 2021-10-25 NOTE — Telephone Encounter (Signed)
Patient is returning your call. Per patient, will be at a doctor's appointment, okay to leave voicemail. ?

## 2021-11-15 ENCOUNTER — Telehealth: Payer: Self-pay | Admitting: Internal Medicine

## 2021-11-15 NOTE — Telephone Encounter (Signed)
Inbound call from patient stating she is in need of a refill of Omeprazole. Patient is requesting It be sent to  [facility] in [facility] [phone]  Patient is also wanting to clarify on how many times a day she needs to take medication. Patient stated Dr. Marina Goodell had changed her to taking it twice daily and now she seems to be under control, so she is unsure if she needs to go  back to once daily.   Please advise.

## 2021-11-16 MED ORDER — OMEPRAZOLE 40 MG PO CPDR: 40.0000 mg | DELAYED_RELEASE_CAPSULE | Freq: Every day | ORAL | 3 refills | Status: DC

## 2021-11-16 NOTE — Telephone Encounter (Signed)
Spoke with patient and she said she is doing very well and thinks taking the Omeprazole once a day should be enough.  I sent in a prescription and told her to call me back if she had further problems.  Patient agreed.

## 2021-11-28 ENCOUNTER — Ambulatory Visit (INDEPENDENT_AMBULATORY_CARE_PROVIDER_SITE_OTHER): Payer: Medicare HMO | Admitting: Internal Medicine

## 2021-11-28 ENCOUNTER — Encounter: Payer: Self-pay | Admitting: Internal Medicine

## 2021-11-28 VITALS — BP 120/82 | HR 80 | Ht 62.0 in | Wt 187.2 lb

## 2021-11-28 DIAGNOSIS — R7989 Other specified abnormal findings of blood chemistry: Secondary | ICD-10-CM | POA: Diagnosis not present

## 2021-11-28 DIAGNOSIS — K52832 Lymphocytic colitis: Secondary | ICD-10-CM | POA: Diagnosis not present

## 2021-11-28 DIAGNOSIS — F1090 Alcohol use, unspecified, uncomplicated: Secondary | ICD-10-CM | POA: Diagnosis not present

## 2021-11-28 DIAGNOSIS — K219 Gastro-esophageal reflux disease without esophagitis: Secondary | ICD-10-CM | POA: Diagnosis not present

## 2021-11-28 MED ORDER — OMEPRAZOLE 40 MG PO CPDR: 40.0000 mg | DELAYED_RELEASE_CAPSULE | Freq: Every day | ORAL | 3 refills | Status: DC

## 2021-11-28 NOTE — Patient Instructions (Signed)
If you are age 68 or older, your body mass index should be between 23-30. Your Body mass index is 34.24 kg/m. If this is out of the aforementioned range listed, please consider follow up with your Primary Care Provider.  If you are age 76 or younger, your body mass index should be between 19-25. Your Body mass index is 34.24 kg/m. If this is out of the aformentioned range listed, please consider follow up with your Primary Care Provider.   ________________________________________________________  The [place] [facility] providers would like to encourage you to use [facility] to communicate with providers for non-urgent requests or questions.  Due to long hold times on the telephone, sending your provider a message by [facility] may be a faster and more efficient way to get a response.  Please allow 48 business hours for a response.  Please remember that this is for non-urgent requests.  _______________________________________________________  We have sent the following medications to your pharmacy for you to pick up at your convenience:  Omeprazole  Stop Budesonide  Stop Alcohol  Please follow up in 6 months

## 2021-11-29 ENCOUNTER — Encounter: Payer: Self-pay | Admitting: Internal Medicine

## 2021-11-29 NOTE — Progress Notes (Signed)
HISTORY OF PRESENT ILLNESS:  Monica Henry is a 66 y.o. female with a history of atrial fibrillation on Eliquis, hyperlipidemia, anxiety/depression, elevated liver test, and chronic alcohol abuse was initially evaluated August 2021 regarding markedly elevated hepatic transaminases.  Her liver test elevations resolved off alcohol and statin.  1 of these was felt to be the culprit.  I last saw the patient January 2022.  She was to follow-up in 6 months, but did not.  However, she presented to this office April 2023 regarding diarrhea and weight loss.  See that dictation.  He subsequently underwent complete colonoscopy October 18, 2021.  Random colon biopsies revealed lymphocytic colitis.  Upper endoscopy that day was performed.  Duodenal biopsies were negative.  She was placed on budesonide 9 mg daily and told to follow-up in the office at this time.  The patient tells me that she had significant improvement in her diarrhea within a week of starting desonide.  She felt like she might become constipated decreased her dose (without medical consultation) to 3 mg daily.  She stays on that dose.  Despite having 5 or 6 bowel movements per day, which are mostly formed, she tells me this is her baseline.  No urgency.  She has gained 13 pounds.  She has resumed alcohol use.  She complains of "a puffy face".  She does continue on omeprazole for GERD.  She did have her dose increased to twice daily for period of time due to breakthrough symptoms.  She is now out of medication.  She wonders if budesonide is caused her "puffy face".  REVIEW OF SYSTEMS:  All non-GI ROS negative unless otherwise stated in the HPI. Past Medical History:  Diagnosis Date   A-fib ([place])    Anal fissure    Anxiety    Aortic aneurysm ([place])    Atrial flutter ([place])    Depression    Hyperlipidemia     Past Surgical History:  Procedure Laterality Date   ABDOMINAL HYSTERECTOMY     BREAST IMPLANT REMOVAL Bilateral    CARDIAC  ELECTROPHYSIOLOGY MAPPING AND ABLATION     CARDIAC PACEMAKER PLACEMENT     COLONOSCOPY     HIATAL HERNIA REPAIR     PLACEMENT OF BREAST IMPLANTS Bilateral    TONSILLECTOMY      Social History [name]  reports that she quit smoking about 42 years ago. Her smoking use included cigarettes. She has never used smokeless tobacco. She reports current alcohol use of about 1.0 standard drink per week. She reports that she does not use drugs.  family history includes Basal cell carcinoma in her father; Cirrhosis in her brother; Diabetes in her maternal grandfather; Diverticulitis in her sister; Heart attack in her brother; Heart disease in her father, mother, and sister; Hypertension in her father; Kidney disease in her brother; Sleep apnea in her sister; Stroke in her maternal grandmother; Stroke (age of onset: 80) in her son.  Allergies  Allergen Reactions   Cephalexin Diarrhea   Sulfa Antibiotics Nausea And Vomiting       PHYSICAL EXAMINATION: Vital signs: BP 120/82   Pulse 80   Ht 5\' 2"  (1.575 m)   Wt 187 lb 3.2 oz (84.9 kg)   SpO2 97%   BMI 34.24 kg/m   Constitutional: generally well-appearing, no acute distress Psychiatric: alert and oriented x3, cooperative Eyes: extraocular movements intact, anicteric, conjunctiva pink Mouth: oral pharynx moist, no lesions Neck: supple no lymphadenopathy Cardiovascular: heart regular rate and rhythm, no murmur Lungs:  clear to auscultation bilaterally Abdomen: soft, nontender, nondistended, no obvious ascites, no peritoneal signs, normal bowel sounds, no organomegaly Rectal: Omitted Extremities: no clubbing, cyanosis, or lower extremity edema bilaterally Skin: no lesions on visible extremities Neuro: No focal deficits. No asterixis.     ASSESSMENT:  1.  Lymphocytic colitis.  Improved on budesonide.  To 9 mg daily for 1 week.  3 mg daily thereafter.  She tells me she is at her baseline.  She is interested in stopping the  medication wondering if this has resulted in weight gain and puffy face. 2.  GERD.  Controlled on PPI 3.  Incidental esophageal ring 4.  History of alcohol/related abnormal liver test 5.  Alcohol use disorder 6.  Obesity   PLAN:  1.  Okay to stop budesonide.  If she relapses, resume. 2.  Reflux precautions 3.  Weight loss 4.  Eliminate or minimize alcohol 5.  Continue omeprazole.  Prescription refilled.  Medication risks reviewed. 6.  Routine office follow-up 6 months.  Contact the office in the interim for any questions or problems. A total time of 30 minutes was spent preparing to see the patient, reviewing studies and pathology, obtaining history, performing medically appropriate physical examination, counseling and educating the patient regarding the multiple above listed issues, ordering medications and directing medical therapy, ordering follow-up, and documenting clinical information in the health record

## 2021-12-21 ENCOUNTER — Telehealth: Payer: Self-pay | Admitting: Internal Medicine

## 2021-12-21 MED ORDER — BUDESONIDE 3 MG PO CPEP: 9.0000 mg | ORAL_CAPSULE | Freq: Every day | ORAL | 6 refills | Status: DC

## 2021-12-21 NOTE — Telephone Encounter (Signed)
Refilled Budesnonide

## 2021-12-24 ENCOUNTER — Other Ambulatory Visit: Payer: Self-pay

## 2021-12-24 ENCOUNTER — Encounter: Payer: Self-pay | Admitting: Internal Medicine

## 2021-12-25 ENCOUNTER — Other Ambulatory Visit: Payer: Self-pay

## 2021-12-25 MED ORDER — BUDESONIDE 3 MG PO CPEP: ORAL_CAPSULE | ORAL | 3 refills | Status: DC

## 2022-02-26 ENCOUNTER — Encounter: Payer: Self-pay | Admitting: Internal Medicine

## 2022-02-26 ENCOUNTER — Ambulatory Visit: Payer: Medicare HMO | Admitting: Internal Medicine

## 2022-02-26 VITALS — BP 136/84 | HR 75 | Ht 62.0 in | Wt 191.0 lb

## 2022-02-26 DIAGNOSIS — K52839 Microscopic colitis, unspecified: Secondary | ICD-10-CM

## 2022-02-26 DIAGNOSIS — R7989 Other specified abnormal findings of blood chemistry: Secondary | ICD-10-CM | POA: Diagnosis not present

## 2022-02-26 DIAGNOSIS — F1090 Alcohol use, unspecified, uncomplicated: Secondary | ICD-10-CM

## 2022-02-26 DIAGNOSIS — K219 Gastro-esophageal reflux disease without esophagitis: Secondary | ICD-10-CM | POA: Diagnosis not present

## 2022-02-26 NOTE — Patient Instructions (Signed)
Continue budesonide 6 mg daily x 2 weeks, then reduce to 3 mg daily x 2 weeks, then stop.  Please call our office if you have any recurrent symptoms of diarrhea after finishing taper.  The [place] [facility] providers would like to encourage you to use [facility] to communicate with providers for non-urgent requests or questions.  Due to long hold times on the telephone, sending your provider a message by [facility] may be a faster and more efficient way to get a response.  Please allow 48 business hours for a response.  Please remember that this is for non-urgent requests.

## 2022-02-26 NOTE — Progress Notes (Signed)
HISTORY OF PRESENT ILLNESS:  Monica Henry is a 66 y.o. female with a history of atrial fibrillation on Eliquis, hyperlipidemia, anxiety/depression, elevated liver test, and chronic alcohol abuse was initially evaluated August 2021 regarding markedly elevated hepatic transaminases.  Her liver test elevations resolved off alcohol and statin.  One of these was felt to be the culprit.  I last saw the patient January 2022.  She was to follow-up in 6 months, but did not.  However, she presented to this office April 2023 regarding diarrhea and weight loss.  See that dictation.  He subsequently underwent complete colonoscopy October 18, 2021.  Random colon biopsies revealed lymphocytic colitis.  Upper endoscopy that day was performed.  Duodenal biopsies were negative.  She was placed on budesonide 9 mg daily and followed up in the office Nov 28, 2021.  At that time, the patient told me me that she had significant improvement in her diarrhea within a week of starting desonide.  She felt like she might become constipated decreased her dose (without medical consultation) to 3 mg daily.  She stated on that dose.  Despite having 5 or 6 bowel movements per day, which are mostly formed, she tells me this is her baseline.  No urgency.  She had gained 13 pounds.  She had resumed alcohol use.  She complained of "a puffy face".  She continued on omeprazole for GERD.  She requested medication refill at that time.  She wonders if budesonide is caused her "puffy face".  At that time she was placed back she was told to stop budesonide.  However, diarrhea returned and she was placed back on budesonide 9 mg daily.  She was to taper by 3 mg every 4 weeks.  However, she has been on 9 mg daily for about 8 weeks.  Just recently tapered to 6 mg daily.  She tells me that she has had no issues with diarrhea.  She does notice easy bruisability.  She tells me that she was diagnosed with hypertension and lisinopril was added.  She has gained  weight since her last visit.  She tells me that she continues to use alcohol, but is doing better.  Trying to "do it on her own" but has contemplated [name].  REVIEW OF SYSTEMS:  All non-GI ROS negative unless otherwise stated in the HPI except for sinus and allergy, depression, fatigue, shortness of breath, hearing problems, irregular heartbeat, muscle cramps  Past Medical History:  Diagnosis Date   A-fib ([facility])    Anal fissure    Anxiety    Aortic aneurysm ([facility])    Atrial flutter ([facility])    Depression    Hyperlipidemia     Past Surgical History:  Procedure Laterality Date   ABDOMINAL HYSTERECTOMY     BREAST IMPLANT REMOVAL Bilateral    CARDIAC ELECTROPHYSIOLOGY MAPPING AND ABLATION     CARDIAC PACEMAKER PLACEMENT     COLONOSCOPY     HIATAL HERNIA REPAIR     PLACEMENT OF BREAST IMPLANTS Bilateral    TONSILLECTOMY      Social History Monica Henry  reports that she quit smoking about 42 years ago. Her smoking use included cigarettes. She has never used smokeless tobacco. She reports current alcohol use of about 1.0 standard drink of alcohol per week. She reports that she does not use drugs.  family history includes Basal cell carcinoma in her father; Cirrhosis in her brother; Diabetes in her maternal grandfather; Diverticulitis in her sister; Heart attack in her brother;  Heart disease in her father, mother, and sister; Hypertension in her father; Kidney disease in her brother; Sleep apnea in her sister; Stroke in her maternal grandmother; Stroke (age of onset: 54) in her son.  Allergies  Allergen Reactions   Cephalexin Diarrhea   Sulfa Antibiotics Nausea And Vomiting       PHYSICAL EXAMINATION: Vital signs: BP 136/84   Pulse 75   Ht 5\' 2"  (1.575 m)   Wt 191 lb (86.6 kg)   BMI 34.93 kg/m   Constitutional: generally well-appearing, no acute distress.  Cushingoid facies Psychiatric: alert and oriented x3, cooperative Eyes: extraocular movements intact,  anicteric, conjunctiva pink Mouth: oral pharynx moist, no lesions Neck: supple no lymphadenopathy Cardiovascular: heart regular rate and rhythm, no murmur Lungs: clear to auscultation bilaterally Abdomen: soft, obese, nontender, nondistended, no obvious ascites, no peritoneal signs, normal bowel sounds, no organomegaly Rectal: Omitted Extremities: no clubbing, cyanosis, or lower extremity edema bilaterally Skin: no lesions on visible extremities save multiple ecchymoses Neuro: No focal deficits. No asterixis.     ASSESSMENT:   1.  Lymphocytic colitis.  Improved on budesonide.  Currently on 6 mg daily. 2.  GERD.  Controlled on PPI 3.  Incidental esophageal ring 4.  History of alcohol/related abnormal liver test 5.  Alcohol use disorder 6.  Obesity     PLAN:   1.  Continue budesonide 6 mg daily for 2 weeks, then 3 mg daily for 2 weeks, then stop.. 2.  Reflux precautions 3.  Weight loss 4.  Eliminate or minimize alcohol 5.  Continue omeprazole.  Prescription refilled.  Medication risks reviewed. 6.  Routine office follow-up 6 months.  Contact the office in the interim for any questions or problems. A total time of 30 minutes was spent preparing to see the patient, reviewing studies and pathology, obtaining history, performing medically appropriate physical examination, counseling and educating the patient regarding the multiple above listed issues, ordering medications and directing medical therapy, ordering follow-up, and documenting clinical information in the health record

## 2022-08-07 NOTE — Telephone Encounter (Signed)
Monica Henry, please contact PCP for copy of labs which showed elevated LFTS. THX

## 2022-08-08 NOTE — Telephone Encounter (Signed)
Pt PCP contacted and requested labs to be faxed over

## 2022-08-08 NOTE — Telephone Encounter (Signed)
Received labs and copies made.  Copy placed on Carl Best NP desk and copy sent to be scanned into Epic.

## 2022-08-13 ENCOUNTER — Encounter: Payer: Self-pay | Admitting: Nurse Practitioner

## 2022-08-13 ENCOUNTER — Other Ambulatory Visit (INDEPENDENT_AMBULATORY_CARE_PROVIDER_SITE_OTHER): Payer: Medicare HMO

## 2022-08-13 ENCOUNTER — Ambulatory Visit: Payer: Medicare HMO | Admitting: Nurse Practitioner

## 2022-08-13 VITALS — BP 110/70 | HR 67 | Ht 62.0 in | Wt 179.0 lb

## 2022-08-13 DIAGNOSIS — R7989 Other specified abnormal findings of blood chemistry: Secondary | ICD-10-CM | POA: Diagnosis not present

## 2022-08-13 DIAGNOSIS — F1090 Alcohol use, unspecified, uncomplicated: Secondary | ICD-10-CM

## 2022-08-13 LAB — COMPREHENSIVE METABOLIC PANEL
ALT: 26 U/L (ref 0–35)
AST: 47 U/L — ABNORMAL HIGH (ref 0–37)
Albumin: 3.9 g/dL (ref 3.5–5.2)
Alkaline Phosphatase: 149 U/L — ABNORMAL HIGH (ref 39–117)
BUN: 7 mg/dL (ref 6–23)
CO2: 29 mEq/L (ref 19–32)
Calcium: 10 mg/dL (ref 8.4–10.5)
Chloride: 99 mEq/L (ref 96–112)
Creatinine, Ser: 0.58 mg/dL (ref 0.40–1.20)
GFR: 92.53 mL/min (ref >60.00)
Glucose, Bld: 106 mg/dL — ABNORMAL HIGH (ref 70–99)
Potassium: 3.5 mEq/L (ref 3.5–5.1)
Sodium: 135 mEq/L (ref 135–145)
Total Bilirubin: 0.9 mg/dL (ref 0.2–1.2)
Total Protein: 7 g/dL (ref 6.0–8.3)

## 2022-08-13 LAB — CBC WITH DIFFERENTIAL/PLATELET
Basophils Absolute: 0.1 10*3/uL (ref 0.0–0.1)
Basophils Relative: 1.1 % (ref 0.0–3.0)
Eosinophils Absolute: 0.1 10*3/uL (ref 0.0–0.7)
Eosinophils Relative: 1.1 % (ref 0.0–5.0)
HCT: 40.2 % (ref 36.0–46.0)
Hemoglobin: 13.4 g/dL (ref 12.0–15.0)
Lymphocytes Relative: 25 % (ref 12.0–46.0)
Lymphs Abs: 1.4 10*3/uL (ref 0.7–4.0)
MCHC: 33.3 g/dL (ref 30.0–36.0)
MCV: 99.3 fl (ref 78.0–100.0)
Monocytes Absolute: 0.6 10*3/uL (ref 0.1–1.0)
Monocytes Relative: 11.2 % (ref 3.0–12.0)
Neutro Abs: 3.4 10*3/uL (ref 1.4–7.7)
Neutrophils Relative %: 61.6 % (ref 43.0–77.0)
Platelets: 199 10*3/uL (ref 150.0–400.0)
RBC: 4.05 Mil/uL (ref 3.87–5.11)
RDW: 14.2 % (ref 11.5–15.5)
WBC: 5.6 10*3/uL (ref 4.0–10.5)

## 2022-08-13 LAB — PROTIME-INR
INR: 1.9 ratio — ABNORMAL HIGH (ref 0.8–1.0)
Prothrombin Time: 20.2 s — ABNORMAL HIGH (ref 9.6–13.1)

## 2022-08-13 NOTE — Patient Instructions (Signed)
You have been scheduled for an abdominal ultrasound at [facility] (1st floor of hospital) on 08/20/22 at 10 am. Please arrive 30 minutes prior to your appointment for registration. Make certain not to have anything to eat or drink after midnight. Should you need to reschedule your appointment, please contact radiology at [phone]. This test typically takes about 30 minutes to perform.  Your provider has requested that you go to the basement level for lab work before leaving today. Press "B" on the elevator. The lab is located at the first door on the left as you exit the elevator.  Further recommendations to be determined once lab and abdominal ultrasound results received.  Due to recent changes in healthcare laws, you may see the results of your imaging and laboratory studies on MyChart before your provider has had a chance to review them.  We understand that in some cases there may be results that are confusing or concerning to you. Not all laboratory results come back in the same time frame and the provider may be waiting for multiple results in order to interpret others.  Please give [place] 48 hours in order for your provider to thoroughly review all the results before contacting the office for clarification of your results.    Thank you for trusting me with your gastrointestinal care!   Carl Best, CRNP

## 2022-08-13 NOTE — Progress Notes (Signed)
Noted  

## 2022-08-13 NOTE — Progress Notes (Signed)
08/13/2022 Qadirah Lindgren [id number] 1956/04/05   Chief Complaint: Elevated liver enzymes   History of Present Illness: Monica Henry is a 67 year old female with a past medical history of anxiety, depression, hypercholesterolemia, SSS s/p pacemaker, atrial fibrillation on Xarelto, alcohol use disorder and elevated LFTs in 2014 and 2021 and GERD. She is known by Dr. Henrene Pastor.  She presents to our office today as referred by Lucretia Kern PA-C for further evaluation regarding elevated LFTs. She underwent laboratory studies by her PCP 07/17/2022 which showed a total bilirubin level of 3.1.  Alk phos 400.  AST 316.  ALT 157.  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  She reported drinking 2-3 bottles of wine daily for the past 1-1/2 years.  She stopped all alcohol consumption 3 weeks ago and she started attending AA meetings 4 days weekly.  She takes Tylenol as needed for aches and pains and sometimes takes Tylenol PM for sleep.  No excessive Tylenol use.  She has intentionally lost 14 pounds over past 1 to 2 months which she also attributes to lack of alcohol consumption.  She had elevated LFTs in 2021 which improved off alcohol and statin.  She has chronic diarrhea, she passes 4-5 nonbloody watery to mud like diarrhea bowel movements daily.  No black stools.  She underwent a colonoscopy 10/18/2021, random biopsies identified lymphocytic colitis for which she was treated with Budesonide. An EGD was done on the same date, biopsies were negative for celiac disease.  GI pathogen panel was negative with a positive fecal lactoferrin level 09/28/2021.  She was last seen by Dr. Henrene Pastor 02/26/2022, at that time she recently decreased Budesonide from 9 to 6 mg and she was instructed to continue Budesonide 6 mg for 2 weeks then 3 mg daily for 2 weeks then stop.  She denies having any GERD symptoms, no longer taking Omeprazole for the past 6 months. She is  on Xarelto for history of A-fib.      Latest Ref Rng & Units 10/08/2021   11:29 AM 09/28/2021   10:30 AM 07/04/2020    3:35 PM  CMP  Glucose 70 - 99 mg/dL 109  115  90   BUN 6 - 23 mg/dL 8  12  12   $ Creatinine 0.40 - 1.20 mg/dL 0.73  0.81  0.67   Sodium 135 - 145 mEq/L 138  140  133   Potassium 3.5 - 5.1 mEq/L 4.5  3.0  4.6   Chloride 96 - 112 mEq/L 104  103  99   CO2 19 - 32 mEq/L 26  28  29   $ Calcium 8.4 - 10.5 mg/dL 9.7  9.5  9.4   Total Protein 6.0 - 8.3 g/dL  6.8  7.3   Total Bilirubin 0.2 - 1.2 mg/dL  0.8  0.9   Alkaline Phos 39 - 117 U/L  62  77   AST 0 - 37 U/L  21  32   ALT 0 - 35 U/L  19  24   TSH 1.72 on 10/11/2021.    Latest Ref Rng & Units 09/28/2021   10:30 AM 07/04/2020    3:35 PM 05/08/2020    2:40 PM  CBC  WBC 4.0 - 10.5 K/uL 6.0  5.3  3.9   Hemoglobin 12.0 - 15.0 g/dL 14.5  13.6  13.9   Hematocrit 36.0 - 46.0 % 43.8  39.7  41.0   Platelets  150.0 - 400.0 K/uL 201.0  126.0  66.0     Labs 02/17/2020: Hepatitis B surface antigen negative.  Hepatitis B core IgM nonreactive.  Hepatitis A IgM nonreactive.  Hepatitis C antibody nonreactive.  ANA positive.  Labs 02/29/2020: Iron 192.  Ferritin 881.1.  Alpha 1 antitrypsin 138.  ANA positive.  AMA < 20. SMA < 20.  Ceruloplasmin 31.  Otitis a total antibody nonreactive.  Labs 04/14/2020: Hemochromatosis DNA C282Y and H63D negative  Abdominal ultrasound 03/17/2020: Normal liver.  Patent portal vein.  Past Medical History:  Diagnosis Date   A-fib ([place])    Anal fissure    Anxiety    Aortic aneurysm ([facility])    Atrial flutter ([place])    Depression    Hyperlipidemia    Past Surgical History:  Procedure Laterality Date   ABDOMINAL HYSTERECTOMY     BREAST IMPLANT REMOVAL Bilateral    CARDIAC ELECTROPHYSIOLOGY MAPPING AND ABLATION     CARDIAC PACEMAKER PLACEMENT     COLONOSCOPY     HIATAL HERNIA REPAIR     PLACEMENT OF BREAST IMPLANTS Bilateral    TONSILLECTOMY       Current Outpatient Medications on File Prior to Visit   Medication Sig Dispense Refill   ALPRAZolam (XANAX) 0.25 MG tablet Take 0.25 mg by mouth at bedtime as needed for anxiety.     cetirizine (ZYRTEC) 10 MG tablet Take 10 mg by mouth as needed.     cholecalciferol (VITAMIN D3) 25 MCG (1000 UNIT) tablet Take 1,000 Units by mouth daily.     co-enzyme Q-10 30 MG capsule Take 30 mg by mouth 3 (three) times daily.     DULoxetine (CYMBALTA) 60 MG capsule Take 60 mg by mouth every morning.     lisinopril (ZESTRIL) 5 MG tablet Take 5 mg by mouth daily.     Melatonin 10 MG TABS Take 1 tablet by mouth as needed.     metoprolol succinate (TOPROL-XL) 25 MG 24 hr tablet Take 25 mg by mouth daily.     rivaroxaban (XARELTO) 20 MG TABS tablet Take 20 mg by mouth daily with supper.     No current facility-administered medications on file prior to visit.   Allergies  Allergen Reactions   Cephalexin Diarrhea   Sulfa Antibiotics Nausea And Vomiting   Current Medications, Allergies, Past Medical History, Past Surgical History, Family History and Social History were reviewed in [employer] record.  Review of Systems:   Constitutional: Negative for fever, sweats, chills or weight loss.  Respiratory: Negative for shortness of breath.   Cardiovascular: Negative for chest pain, palpitations and leg swelling.  Gastrointestinal: See HPI.  Musculoskeletal: Negative for back pain or muscle aches.  Neurological: Negative for dizziness, headaches or paresthesias.    Physical Exam: Ht 5' 2"$  (1.575 m)   Wt 179 lb (81.2 kg)   BMI 32.74 kg/m  General: 67 year old female in no acute distress. Head: Normocephalic and atraumatic. Eyes: No scleral icterus. Conjunctiva pink . Ears: Normal auditory acuity. Mouth: Dentition intact. No ulcers or lesions.  Lungs: Clear throughout to auscultation. Heart: Regular rate and rhythm, no murmur. Abdomen: Soft, nontender and nondistended. No masses or hepatomegaly. Normal bowel sounds x 4 quadrants.   Rectal: Deferred.  Musculoskeletal: Symmetrical with no gross deformities. Extremities: No edema. Neurological: Alert oriented x 4. No focal deficits.  Psychological: Alert and cooperative. Normal mood and affect  Assessment and Recommendations:  47) 67 year old female with a history of alcohol use disorder  with recurrent elevated LFTs. Negative Hep A, B and C serologies.  Positive ANA with normal AMA and SMA levels.  Negative hemochromatosis mutations. No alcohol x 3 weeks.  -Patient encouraged to continue complete alcohol abstinence and attend AA meetings 4 days weekly -CMP, CBC and INR -Check IgG level if LFTs remain elevated  -RUQ sonogram   2) Lymphocytic colitis, chronic diarrhea -Monitor symptoms, diarrhea may continue to improve off alcohol -Patient does not wish to take Budesonide at this time   3) GERD, asymptomatic. No longer taking PPI.   4) Afib on Xarelto   Further recommendations to be determined after the above evaluation completed

## 2022-08-14 ENCOUNTER — Telehealth: Payer: Self-pay

## 2022-08-14 NOTE — Telephone Encounter (Signed)
Recent office visit fax sent to Lucretia Kern PA office at [phone].  Conformation page received

## 2022-08-14 NOTE — Telephone Encounter (Signed)
Message Received: Tracie Harrier, Patrecia Pour, NP  Irene Shipper, MD; Gillermina Hu, RN Remo Lipps, pls fax a copy of today's office visit to PCP as they are not listed in [place]. Thx

## 2022-08-20 ENCOUNTER — Ambulatory Visit (HOSPITAL_COMMUNITY)
Admission: RE | Admit: 2022-08-20 | Discharge: 2022-08-20 | Disposition: A | Discharge status: Home / Self Care | Payer: Medicare HMO | Source: Ambulatory Visit | Attending: Nurse Practitioner | Admitting: Nurse Practitioner

## 2022-08-20 DIAGNOSIS — R7989 Other specified abnormal findings of blood chemistry: Secondary | ICD-10-CM

## 2022-08-20 DIAGNOSIS — F1090 Alcohol use, unspecified, uncomplicated: Secondary | ICD-10-CM | POA: Diagnosis present

## 2022-08-21 ENCOUNTER — Other Ambulatory Visit: Payer: Self-pay

## 2022-08-21 DIAGNOSIS — F1090 Alcohol use, unspecified, uncomplicated: Secondary | ICD-10-CM

## 2022-08-21 DIAGNOSIS — R7989 Other specified abnormal findings of blood chemistry: Secondary | ICD-10-CM

## 2022-09-13 ENCOUNTER — Other Ambulatory Visit (INDEPENDENT_AMBULATORY_CARE_PROVIDER_SITE_OTHER): Payer: Medicare HMO

## 2022-09-13 DIAGNOSIS — F1090 Alcohol use, unspecified, uncomplicated: Secondary | ICD-10-CM | POA: Diagnosis not present

## 2022-09-13 DIAGNOSIS — R7989 Other specified abnormal findings of blood chemistry: Secondary | ICD-10-CM | POA: Diagnosis not present

## 2022-09-13 LAB — HEPATIC FUNCTION PANEL
ALT: 21 U/L (ref 0–35)
AST: 36 U/L (ref 0–37)
Albumin: 4.2 g/dL (ref 3.5–5.2)
Alkaline Phosphatase: 132 U/L — ABNORMAL HIGH (ref 39–117)
Bilirubin, Direct: 0.3 mg/dL (ref 0.0–0.3)
Total Bilirubin: 0.8 mg/dL (ref 0.2–1.2)
Total Protein: 7.3 g/dL (ref 6.0–8.3)

## 2022-10-14 ENCOUNTER — Encounter: Payer: Self-pay | Admitting: Internal Medicine

## 2022-10-14 ENCOUNTER — Ambulatory Visit: Payer: Medicare HMO | Admitting: Internal Medicine

## 2022-10-14 VITALS — BP 126/80 | HR 78 | Ht 62.0 in | Wt 169.0 lb

## 2022-10-14 DIAGNOSIS — R7989 Other specified abnormal findings of blood chemistry: Secondary | ICD-10-CM | POA: Diagnosis not present

## 2022-10-14 DIAGNOSIS — Z6830 Body mass index (BMI) 30.0-30.9, adult: Secondary | ICD-10-CM

## 2022-10-14 DIAGNOSIS — K52839 Microscopic colitis, unspecified: Secondary | ICD-10-CM

## 2022-10-14 DIAGNOSIS — F1090 Alcohol use, unspecified, uncomplicated: Secondary | ICD-10-CM | POA: Diagnosis not present

## 2022-10-14 DIAGNOSIS — E6609 Other obesity due to excess calories: Secondary | ICD-10-CM | POA: Diagnosis not present

## 2022-10-14 NOTE — Progress Notes (Signed)
HISTORY OF PRESENT ILLNESS:  Monica Henry is a 67 y.o. female with past medical history as listed below including atrial fibrillation for which she is on Xarelto.  She presents today for routine office follow-up.  She is followed in this office for alcohol related liver abnormalities.  Colitis.  She was last reevaluated in the office by the GI nurse practitioner February 2024 regarding abnormal liver tests.  She had been drinking.  Liver test that time noted.  Abdominal ultrasound with slightly irregular hepatic contour.  Patient tells me that she has been abstinent from alcohol for 3 months.  During this timeframe she has lost 30 pounds.  She is attending AA support group.  She has had ongoing improvement of her liver tests after sustained abstinence from alcohol.  Her last liver tests 07/15/2022 were essentially normal with AST 36, ALT 21, alkaline phosphatase 132, total bilirubin 0.8.  CBC with MCV of 99.3 and platelet count 199,000.\  She states she is feeling very well off alcohol.  She still has some problems with diarrhea but no incontinence and describes this is manageable.  Not interested in resuming budesonide  REVIEW OF SYSTEMS:  All non-GI ROS negative unless otherwise stated except for sinus and allergy trouble, back pain, cough, depression, fatigue, irregular heartbeat, hearing problems, sleeping problems  Past Medical History:  Diagnosis Date   A-fib    Anal fissure    Anxiety    Aortic aneurysm    Atrial flutter    Depression    Hyperlipidemia     Past Surgical History:  Procedure Laterality Date   ABDOMINAL HYSTERECTOMY     BREAST IMPLANT REMOVAL Bilateral    CARDIAC ELECTROPHYSIOLOGY MAPPING AND ABLATION     CARDIAC PACEMAKER PLACEMENT     COLONOSCOPY     HIATAL HERNIA REPAIR     PLACEMENT OF BREAST IMPLANTS Bilateral    TONSILLECTOMY      Social History Sonata Hockley  reports that she quit smoking about 42 years ago. Her smoking use included  cigarettes. She has never used smokeless tobacco. She reports current alcohol use of about 1.0 standard drink of alcohol per week. She reports that she does not use drugs.  family history includes Basal cell carcinoma in her father; Cirrhosis in her brother; Diabetes in her maternal grandfather; Diverticulitis in her sister; Heart attack in her brother; Heart disease in her father, mother, and sister; Hypertension in her father; Kidney disease in her brother; Sleep apnea in her sister; Stroke in her maternal grandmother; Stroke (age of onset: 30) in her son.  Allergies  Allergen Reactions   Cephalexin Diarrhea   Sulfa Antibiotics Nausea And Vomiting       PHYSICAL EXAMINATION: Vital signs: BP 126/80   Pulse 78   Ht 5\' 2"  (1.575 m)   Wt 169 lb (76.7 kg)   BMI 30.91 kg/m   Constitutional: generally well-appearing, no acute distress Psychiatric: alert and oriented x3, cooperative Eyes: extraocular movements intact, anicteric, conjunctiva pink Mouth: oral pharynx moist, no lesions Neck: supple no lymphadenopathy Cardiovascular: heart regular rate and rhythm, no murmur Lungs: clear to auscultation bilaterally Abdomen: soft, nontender, nondistended, no obvious ascites, no peritoneal signs, normal bowel sounds, no organomegaly Rectal: Omitted Extremities: no clubbing, cyanosis, or lower extremity edema bilaterally Skin: no lesions on visible extremities Neuro: No focal deficits.  No asterixis.  Cranial nerves intact  ASSESSMENT:  1.  Alcohol related liver injury.  Improving off alcohol 2.  Chronic alcoholism.  Has been abstinent for  3 months.  Attending AA meetings 3.  Obesity.  Intentional weight loss 4.  Microscopic colitis 5.  General medical problems including A-fib on Xarelto  PLAN:  1.  Continued complete abstinence from alcohol 2.  Continue with AA support group 3.  Repeat liver tests in June 4.  Routine GI office follow-up 6 months 5.  Surveillance colonoscopy 2033  A  total time of 30 minutes was spent preparing to seeing the patient, reviewing a myriad of data, obtaining interval history, performing medically appropriate physical examination, counseling educating the patient regarding the above listed issues, ordering blood work, arranging follow-up, and documenting clinical information in the health record.

## 2022-10-14 NOTE — Patient Instructions (Addendum)
Please follow up in 6 months   _______________________________________________________  If your blood pressure at your visit was 140/90 or greater, please contact your primary care physician to follow up on this.  _______________________________________________________  If you are age 69 or older, your body mass index should be between 23-30. Your Body mass index is 30.91 kg/m. If this is out of the aforementioned range listed, please consider follow up with your Primary Care Provider.  If you are age 33 or younger, your body mass index should be between 19-25. Your Body mass index is 30.91 kg/m. If this is out of the aformentioned range listed, please consider follow up with your Primary Care Provider.   ________________________________________________________  The [facility] providers would like to encourage you to use [facility] to communicate with providers for non-urgent requests or questions.  Due to long hold times on the telephone, sending your provider a message by [facility] may be a faster and more efficient way to get a response.  Please allow 48 business hours for a response.  Please remember that this is for non-urgent requests.  _______________________________________________________  It was a pleasure to see you today!  Thank you for trusting me with your gastrointestinal care!

## 2022-12-13 ENCOUNTER — Other Ambulatory Visit: Payer: Self-pay

## 2022-12-13 DIAGNOSIS — R7989 Other specified abnormal findings of blood chemistry: Secondary | ICD-10-CM

## 2022-12-20 ENCOUNTER — Other Ambulatory Visit (INDEPENDENT_AMBULATORY_CARE_PROVIDER_SITE_OTHER): Payer: Medicare HMO

## 2022-12-20 DIAGNOSIS — R7989 Other specified abnormal findings of blood chemistry: Secondary | ICD-10-CM

## 2022-12-20 DIAGNOSIS — F1090 Alcohol use, unspecified, uncomplicated: Secondary | ICD-10-CM

## 2022-12-20 LAB — HEPATIC FUNCTION PANEL
ALT: 19 U/L (ref 0–35)
AST: 23 U/L (ref 0–37)
Albumin: 4.2 g/dL (ref 3.5–5.2)
Alkaline Phosphatase: 91 U/L (ref 39–117)
Bilirubin, Direct: 0.2 mg/dL (ref 0.0–0.3)
Total Bilirubin: 0.5 mg/dL (ref 0.2–1.2)
Total Protein: 7.1 g/dL (ref 6.0–8.3)

## 2022-12-25 ENCOUNTER — Telehealth: Payer: Self-pay

## 2022-12-25 NOTE — Telephone Encounter (Signed)
Pt called & pt verbalized understanding of normal lab results.

## 2023-01-29 ENCOUNTER — Other Ambulatory Visit: Payer: Self-pay | Admitting: Internal Medicine

## 2023-03-28 ENCOUNTER — Other Ambulatory Visit: Payer: Self-pay | Admitting: Internal Medicine

## 2023-08-23 ENCOUNTER — Other Ambulatory Visit: Payer: Self-pay | Admitting: Internal Medicine

## 2023-10-30 HISTORY — PX: BLADDER SURGERY: SHX569

## 2023-12-17 ENCOUNTER — Other Ambulatory Visit: Payer: Self-pay | Admitting: Internal Medicine

## 2024-02-10 NOTE — Progress Notes (Signed)
 Monica Console, PA-C [address] [place], [place]  [address] Phone: [phone]   Primary Care Physician: Archie Setter, [place]  Primary Gastroenterologist:  Monica Console, PA-C / Norleen Kiang, MD   Chief Complaint: Worsening diarrhea and lymphocytic colitis       HPI:   Monica Henry is a 68 y.o. female, an established patient of Dr. Kiang presents for persistent worsening diarrhea.  History of microscopic (lymphocytic) colitis.  Currently on Xarelto for A-fib.  Patient states she has had chronic daily worsening diarrhea for 1 year.  She has taken budesonide  3 mg 1 capsule daily sporadically as needed which has helped her diarrhea.  Initially diagnosed with microscopic colitis in 2023.  Initially started on budesonide  9 mg daily which caused constipation and she discontinued.  She is reluctant to take budesonide .  She currently has anywhere from 6-10 watery stools per day.  She is wearing depends at night for the past 2 months due to fecal incontinence at nighttime.  Of note she has been on Macrobid for UTI for 3 days.  She also has felt dizzy for the past week and has had some mild lower abdominal cramping..  She drinks lots of fluids with Powerade daily.  Denies melena or hematochezia.  Note, she is flying to [place] in 2 days to help her son who had a stroke.  She previously had elevated liver test thought secondary to alcohol use.  History of alcohol use disorder.  She stopped drinking all alcohol January 2024.  She also drastically changed to healthy diet and exercise.  She has intentionally lost 60 pounds in the past year with healthy lifestyle.  She feels a lot better and is happier.  09/2021 EGD: Large caliber ring at the GE junction.  Otherwise normal esophagus, stomach, and duodenum.  No evidence of esophageal varices.  Biopsies negative for celiac.  09/2021 colonoscopy: No polyps.  10-year repeat.  Colon biopsies positive for lymphocytic colitis treated with  budesonide .  08/2022 RUQ abdominal ultrasound: 1. The liver demonstrates increased coarsened echogenicity. Possible mildly nodular contour raising the possibility of cirrhosis.  other abnormality in the liver. 2. The gallbladder and common bile duct are normal.  11/2022: Normal hepatic panel.  Normal LFTs.  Component Ref Range & Units (hover) 1 yr ago (12/20/22) 1 yr ago (09/13/22) 1 yr ago (08/13/22) 2 yr ago (09/28/21) 3 yr ago (07/04/20) 3 yr ago (06/02/20) 3 yr ago (05/08/20)  Total Bilirubin 0.5 0.8 0.9 0.8 0.9 1.2 1.6 High   Bilirubin, Direct 0.2 0.3    0.3   Alkaline Phosphatase 91 132 High  149 High  62 77 80 123 High   AST 23 36 47 High  21 32 53 High  260 High   ALT 19 21 26 19 24  56 High  166 High   Total Protein 7.1 7.3 7.0 6.8 7.3 7.2 6.7  Albumin 4.2 4.2 3.9 4.1 4.2 4.1 3.7    Current Outpatient Medications  Medication Sig Dispense Refill   ALPRAZolam (XANAX) 0.25 MG tablet Take 0.25 mg by mouth at bedtime as needed for anxiety.     ascorbic acid (VITAMIN C) 1000 MG tablet Take 1,000 mg by mouth daily.     budesonide  (ENTOCORT EC ) 3 MG 24 hr capsule Take 3 mg by mouth daily as needed.     cetirizine (ZYRTEC) 10 MG tablet Take 10 mg by mouth as needed.     cholecalciferol (VITAMIN D3) 25 MCG (1000 UNIT)  tablet Take 1,000 Units by mouth daily.     co-enzyme Q-10 30 MG capsule Take 30 mg by mouth 3 (three) times daily.     DULoxetine (CYMBALTA) 60 MG capsule Take 60 mg by mouth every morning.     estradiol (ESTRACE) 0.1 MG/GM vaginal cream Place 1 Applicatorful vaginally once a week.     lisinopril (ZESTRIL) 5 MG tablet Take 5 mg by mouth daily. (Patient taking differently: Take 2.5 mg by mouth daily.)     Magnesium Gluconate 550 MG TABS Take 30 mg by mouth daily.     Melatonin 10 MG TABS Take 1 tablet by mouth as needed.     metoprolol succinate (TOPROL-XL) 25 MG 24 hr tablet Take 25 mg by mouth daily.     nitrofurantoin (MACRODANTIN) 100 MG capsule Take 100 mg by mouth 2  (two) times daily.     omeprazole  (PRILOSEC) 40 MG capsule Take 1 capsule (40 mg total) by mouth daily. Office visit for further refills (Patient taking differently: Take 40 mg by mouth daily as needed.) 90 capsule 0   rivaroxaban (XARELTO) 20 MG TABS tablet Take 20 mg by mouth daily with supper.     traZODone (DESYREL) 50 MG tablet Take 50 mg by mouth at bedtime.     vitamin B-12 (CYANOCOBALAMIN) 100 MCG tablet Take 100 mcg by mouth daily.     zinc sulfate, 50mg  elemental zinc, 220 (50 Zn) MG capsule Take 220 mg by mouth daily.     No current facility-administered medications for this visit.    Allergies as of 02/11/2024 - Reviewed 02/11/2024  Allergen Reaction Noted   Cephalexin Diarrhea 08/30/2021   Sulfa antibiotics Nausea And Vomiting 02/23/2020    Past Medical History:  Diagnosis Date   A-fib [facility])    Anal fissure    Anxiety    Aortic aneurysm (HCC)    Atrial flutter (HCC)    Depression    Hyperlipidemia     Past Surgical History:  Procedure Laterality Date   ABDOMINAL HYSTERECTOMY     BLADDER SURGERY  10/2023   to coat bladder   BREAST IMPLANT REMOVAL Bilateral    CARDIAC ELECTROPHYSIOLOGY MAPPING AND ABLATION     CARDIAC PACEMAKER PLACEMENT     COLONOSCOPY     HIATAL HERNIA REPAIR     PLACEMENT OF BREAST IMPLANTS Bilateral    TONSILLECTOMY      Review of Systems:    All systems reviewed and negative except where noted in HPI.    Physical Exam:  BP 122/78   Pulse 65   Ht 5' 2 (1.575 m)   Wt 143 lb (64.9 kg)   BMI 26.16 kg/m  No LMP recorded. Patient has had a hysterectomy.  General: Well-nourished, well-developed in no acute distress.  Lungs: Clear to auscultation bilaterally. Non-labored. Heart: Regular rate and rhythm, no murmurs rubs or gallops.  Abdomen: Bowel sounds are normal; Abdomen is Soft; No hepatosplenomegaly, masses or hernias;  No Abdominal Tenderness; No guarding or rebound tenderness. Rectal: A few small external hemorrhoid skin tags  present.  No evidence of thrombosed external hemorrhoids.  No rectal masses or fissures. Neuro: Alert and oriented x 3.  Grossly intact.  Psych: Alert and cooperative, normal mood and affect.  Chaperone for Exam:  Monica Henry, CMA    Imaging Studies: No results found.  Labs: CBC    Component Value Date/Time   WBC 5.6 08/13/2022 1517   RBC 4.05 08/13/2022 1517   HGB 13.4 08/13/2022  1517   HCT 40.2 08/13/2022 1517   PLT 199.0 08/13/2022 1517   MCV 99.3 08/13/2022 1517   MCHC 33.3 08/13/2022 1517   RDW 14.2 08/13/2022 1517   LYMPHSABS 1.4 08/13/2022 1517   MONOABS 0.6 08/13/2022 1517   EOSABS 0.1 08/13/2022 1517   BASOSABS 0.1 08/13/2022 1517    CMP     Component Value Date/Time   NA 135 08/13/2022 1517   K 3.5 08/13/2022 1517   CL 99 08/13/2022 1517   CO2 29 08/13/2022 1517   GLUCOSE 106 (H) 08/13/2022 1517   BUN 7 08/13/2022 1517   CREATININE 0.58 08/13/2022 1517   CALCIUM 10.0 08/13/2022 1517   PROT 7.1 12/20/2022 1225   ALBUMIN 4.2 12/20/2022 1225   AST 23 12/20/2022 1225   ALT 19 12/20/2022 1225   ALKPHOS 91 12/20/2022 1225   BILITOT 0.5 12/20/2022 1225     Assessment and Plan:   Monica Henry is a 68 y.o. y/o female presents for:  1.  Diarrhea: Differential includes infectious colitis versus flareup of lymphocytic colitis. - Lab CBC, CMP - I collected [facility] GI panel with C. difficile stool specimen today. - I advised patient to go ahead and take budesonide  3 mg 1 capsule every day consistently. - If [facility] stool test is negative for infections, then she can also take OTC Imodium as needed, max 8 tablets/day.  2.  Lymphocytic colitis - Patient just refilled budesonide .  I advised her to let me know when she needs another refill sent to her pharmacy. - Patient education given. - She is instructed to avoid NSAIDs.  3.  History of alcohol dependence: In remission and has not drank any alcohol since January 2024. - I commended patient on her  abstinence from alcohol and healthy lifestyle choices.  4.  History of elevated LFTs - Lab: CMP   Monica Console, PA-C  Follow up in 6 weeks with TG.

## 2024-02-11 ENCOUNTER — Other Ambulatory Visit (INDEPENDENT_AMBULATORY_CARE_PROVIDER_SITE_OTHER)

## 2024-02-11 ENCOUNTER — Ambulatory Visit: Admitting: Physician Assistant

## 2024-02-11 ENCOUNTER — Ambulatory Visit: Payer: Self-pay | Admitting: Physician Assistant

## 2024-02-11 ENCOUNTER — Encounter: Payer: Self-pay | Admitting: Physician Assistant

## 2024-02-11 VITALS — BP 122/78 | HR 65 | Ht 62.0 in | Wt 143.0 lb

## 2024-02-11 DIAGNOSIS — K52832 Lymphocytic colitis: Secondary | ICD-10-CM | POA: Diagnosis not present

## 2024-02-11 DIAGNOSIS — Z8719 Personal history of other diseases of the digestive system: Secondary | ICD-10-CM

## 2024-02-11 DIAGNOSIS — R7989 Other specified abnormal findings of blood chemistry: Secondary | ICD-10-CM

## 2024-02-11 DIAGNOSIS — F1021 Alcohol dependence, in remission: Secondary | ICD-10-CM

## 2024-02-11 DIAGNOSIS — R197 Diarrhea, unspecified: Secondary | ICD-10-CM

## 2024-02-11 LAB — CBC
HCT: 41.4 % (ref 36.0–46.0)
Hemoglobin: 13.5 g/dL (ref 12.0–15.0)
MCHC: 32.6 g/dL (ref 30.0–36.0)
MCV: 92.8 fl (ref 78.0–100.0)
Platelets: 203 K/uL (ref 150.0–400.0)
RBC: 4.46 Mil/uL (ref 3.87–5.11)
RDW: 13.4 % (ref 11.5–15.5)
WBC: 9 K/uL (ref 4.0–10.5)

## 2024-02-11 LAB — COMPREHENSIVE METABOLIC PANEL WITH GFR
ALT: 42 U/L — ABNORMAL HIGH (ref 0–35)
AST: 36 U/L (ref 0–37)
Albumin: 4.5 g/dL (ref 3.5–5.2)
Alkaline Phosphatase: 115 U/L (ref 39–117)
BUN: 13 mg/dL (ref 6–23)
CO2: 30 meq/L (ref 19–32)
Calcium: 9.5 mg/dL (ref 8.4–10.5)
Chloride: 101 meq/L (ref 96–112)
Creatinine, Ser: 0.72 mg/dL (ref 0.40–1.20)
GFR: 84.6 mL/min (ref >60.00)
Glucose, Bld: 86 mg/dL (ref 70–99)
Potassium: 4 meq/L (ref 3.5–5.1)
Sodium: 137 meq/L (ref 135–145)
Total Bilirubin: 0.4 mg/dL (ref 0.2–1.2)
Total Protein: 7.5 g/dL (ref 6.0–8.3)

## 2024-02-11 NOTE — Patient Instructions (Signed)
 Your provider has requested that you go to the basement level for lab work before leaving today. Press B on the elevator. The lab is located at the first door on the left as you exit the elevator.  Your provider has ordered [facility] stool testing for you. You have received a kit from our office today containing all necessary supplies to complete this test. Please carefully read the stool collection instructions provided in the kit before opening the accompanying materials. In addition, be sure there is a label providing your full name and date of birth on the puritan opti-swab tube that is supplied in the kit (if you do not see a label with this information on your test tube, please make us  aware before test collection!). After completing the test, you should secure the purtian tube into the specimen biohazard bag. The [facility] E-Req sheet (including date and time of specimen collection) should be placed into the outside pocket of the specimen biohazard bag and returned to the [place] lab (basement floor of [facility]) within 3 days of collection. Please make sure to give the specimen to a staff member at the lab. DO NOT leave the specimen on the counter.   If the specimen date and time (can be found in the upper right boxed portion of the sheet) are not filled out on the E-Req sheet, the test will NOT be performed.   Please follow up sooner if symptoms increase or worsen  Due to recent changes in healthcare laws, you may see the results of your imaging and laboratory studies on MyChart before your provider has had a chance to review them.  We understand that in some cases there may be results that are confusing or concerning to you. Not all laboratory results come back in the same time frame and the provider may be waiting for multiple results in order to interpret others.  Please give us  48 hours in order for your provider to thoroughly review all the results  before contacting the office for clarification of your results.   Thank you for trusting me with your gastrointestinal care!   Ellouise Console, PA-C _______________________________________________________  If your blood pressure at your visit was 140/90 or greater, please contact your primary care physician to follow up on this.  _______________________________________________________  If you are age 74 or older, your body mass index should be between 23-30. Your Body mass index is 26.16 kg/m. If this is out of the aforementioned range listed, please consider follow up with your Primary Care Provider.  If you are age 70 or younger, your body mass index should be between 19-25. Your Body mass index is 26.16 kg/m. If this is out of the aformentioned range listed, please consider follow up with your Primary Care Provider.   ________________________________________________________  The [facility] providers would like to encourage you to use MYCHART to communicate with providers for non-urgent requests or questions.  Due to long hold times on the telephone, sending your provider a message by [facility] may be a faster and more efficient way to get a response.  Please allow 48 business hours for a response.  Please remember that this is for non-urgent requests.  _______________________________________________________

## 2024-02-11 NOTE — Progress Notes (Signed)
 Noted

## 2024-02-12 ENCOUNTER — Telehealth: Payer: Self-pay | Admitting: Physician Assistant

## 2024-02-12 NOTE — Telephone Encounter (Signed)
 Informed patient that her [facility] stool test is negative for all infections. Informed patient to continue current plan outlined at her last appt with Ellouise Console, PA. Patient verbalized understanding to continue budesonide  for her lymphocytic colitis.

## 2024-02-12 NOTE — Telephone Encounter (Signed)
 [facility] stool test is negative for all infections.  There is no infection to cause diarrhea.  This is great news!  Continue with current plan to take budesonide  to treat lymphocytic colitis as we discussed at her recent office visit. Monica Console, PA-C

## 2024-02-18 ENCOUNTER — Encounter: Payer: Self-pay | Admitting: Physician Assistant

## 2024-03-02 ENCOUNTER — Other Ambulatory Visit: Payer: Self-pay

## 2024-03-02 MED ORDER — OMEPRAZOLE 40 MG PO CPDR
40.0000 mg | DELAYED_RELEASE_CAPSULE | Freq: Every day | ORAL | 2 refills | Status: AC
Start: 2024-03-02 — End: ?
